# Patient Record
Sex: Female | Born: 1975 | Race: White | Hispanic: No | Marital: Married | Smoking: Never smoker
Health system: Southern US, Community
[De-identification: ages and names within clinical notes are randomized; demographics above are authoritative.]

## PROBLEM LIST (undated history)

## (undated) DIAGNOSIS — T7840XA Allergy, unspecified, initial encounter: Secondary | ICD-10-CM

## (undated) DIAGNOSIS — R51 Headache: Secondary | ICD-10-CM

## (undated) DIAGNOSIS — K649 Unspecified hemorrhoids: Secondary | ICD-10-CM

## (undated) HISTORY — DX: Unspecified hemorrhoids: K64.9

## (undated) HISTORY — DX: Allergy, unspecified, initial encounter: T78.40XA

## (undated) HISTORY — PX: OTHER SURGICAL HISTORY: SHX169

## (undated) HISTORY — PX: WISDOM TOOTH EXTRACTION: SHX21

---

## 2000-06-07 ENCOUNTER — Other Ambulatory Visit: Admission: RE | Admit: 2000-06-07 | Discharge: 2000-06-07 | Payer: Self-pay | Admitting: Obstetrics and Gynecology

## 2001-10-10 ENCOUNTER — Other Ambulatory Visit: Admission: RE | Admit: 2001-10-10 | Discharge: 2001-10-10 | Payer: Self-pay | Admitting: Obstetrics and Gynecology

## 2002-11-19 ENCOUNTER — Other Ambulatory Visit: Admission: RE | Admit: 2002-11-19 | Discharge: 2002-11-19 | Payer: Self-pay | Admitting: Obstetrics and Gynecology

## 2003-12-02 ENCOUNTER — Other Ambulatory Visit: Admission: RE | Admit: 2003-12-02 | Discharge: 2003-12-02 | Payer: Self-pay | Admitting: Obstetrics and Gynecology

## 2005-01-17 ENCOUNTER — Other Ambulatory Visit: Admission: RE | Admit: 2005-01-17 | Discharge: 2005-01-17 | Payer: Self-pay | Admitting: Obstetrics and Gynecology

## 2006-02-08 ENCOUNTER — Other Ambulatory Visit: Admission: RE | Admit: 2006-02-08 | Discharge: 2006-02-08 | Payer: Self-pay | Admitting: Obstetrics and Gynecology

## 2009-09-29 ENCOUNTER — Inpatient Hospital Stay (HOSPITAL_COMMUNITY): Admission: AD | Admit: 2009-09-29 | Discharge: 2009-10-02 | Payer: Self-pay | Admitting: Obstetrics and Gynecology

## 2010-02-09 ENCOUNTER — Encounter: Admission: RE | Admit: 2010-02-09 | Discharge: 2010-02-09 | Payer: Self-pay | Admitting: Family Medicine

## 2011-04-05 LAB — CBC
HCT: 28.4 % — ABNORMAL LOW (ref 36.0–46.0)
MCHC: 34.6 g/dL (ref 30.0–36.0)
MCV: 94.9 fL (ref 78.0–100.0)
RDW: 12.9 % (ref 11.5–15.5)

## 2011-04-06 LAB — CBC
HCT: 36 % (ref 36.0–46.0)
Hemoglobin: 12.4 g/dL (ref 12.0–15.0)
MCHC: 34.5 g/dL (ref 30.0–36.0)
MCV: 94.5 fL (ref 78.0–100.0)
Platelets: 237 K/uL (ref 150–400)
RBC: 3.81 MIL/uL — ABNORMAL LOW (ref 3.87–5.11)
RDW: 12.6 % (ref 11.5–15.5)
WBC: 10.6 K/uL — ABNORMAL HIGH (ref 4.0–10.5)

## 2011-08-27 ENCOUNTER — Encounter (HOSPITAL_COMMUNITY): Payer: Self-pay | Admitting: *Deleted

## 2011-08-27 NOTE — H&P (Signed)
35 year old female G2P1 with missed ab  Ultrasound in office revealed fetus size less than dates and no heart tones Fetus measured 8 weeks and 5 days Pt reports some spotting over past week  Med hx Unremarkable  surg hx None  meds None  All Compazine  Af vss Lung ctab Car rrr  Pelvic Uterus 8 week size  Imp Missed ab  Plan d and e  Risks discussed with patient Rh positive

## 2011-08-28 ENCOUNTER — Encounter (HOSPITAL_COMMUNITY): Payer: Self-pay | Admitting: *Deleted

## 2011-08-28 ENCOUNTER — Ambulatory Visit (HOSPITAL_COMMUNITY): Payer: 59 | Admitting: Anesthesiology

## 2011-08-28 ENCOUNTER — Other Ambulatory Visit: Payer: Self-pay | Admitting: Obstetrics and Gynecology

## 2011-08-28 ENCOUNTER — Encounter (HOSPITAL_COMMUNITY): Payer: Self-pay | Admitting: Anesthesiology

## 2011-08-28 ENCOUNTER — Ambulatory Visit (HOSPITAL_COMMUNITY)
Admission: RE | Admit: 2011-08-28 | Discharge: 2011-08-28 | Disposition: A | Discharge status: Home / Self Care | Payer: 59 | Source: Ambulatory Visit | Attending: Obstetrics and Gynecology | Admitting: Obstetrics and Gynecology

## 2011-08-28 ENCOUNTER — Encounter (HOSPITAL_COMMUNITY)
Admission: RE | Disposition: A | Discharge status: Home / Self Care | Payer: Self-pay | Source: Ambulatory Visit | Attending: Obstetrics and Gynecology

## 2011-08-28 DIAGNOSIS — O021 Missed abortion: Secondary | ICD-10-CM | POA: Insufficient documentation

## 2011-08-28 HISTORY — DX: Headache: R51

## 2011-08-28 HISTORY — PX: DILATION AND EVACUATION: SHX1459

## 2011-08-28 LAB — CBC
HCT: 36.7 % (ref 36.0–46.0)
Hemoglobin: 12.1 g/dL (ref 12.0–15.0)
MCHC: 33 g/dL (ref 30.0–36.0)
RBC: 3.9 MIL/uL (ref 3.87–5.11)
RDW: 12.2 % (ref 11.5–15.5)

## 2011-08-28 LAB — ABO/RH: ABO/RH(D): O POS

## 2011-08-28 SURGERY — DILATION AND EVACUATION, UTERUS
Anesthesia: Monitor Anesthesia Care | Site: Uterus | Wound class: Clean Contaminated

## 2011-08-28 MED ORDER — FENTANYL CITRATE 0.05 MG/ML IJ SOLN
INTRAMUSCULAR | Status: DC | PRN
  Administered 2011-08-28 (×2): 50 ug via INTRAVENOUS

## 2011-08-28 MED ORDER — LACTATED RINGERS IV SOLN
INTRAVENOUS | Status: DC
  Administered 2011-08-28: 07:00:00 via INTRAVENOUS

## 2011-08-28 MED ORDER — FENTANYL CITRATE 0.05 MG/ML IJ SOLN: 25.0000 ug | INTRAMUSCULAR | Status: DC | PRN

## 2011-08-28 MED ORDER — LIDOCAINE HCL (CARDIAC) 20 MG/ML IV SOLN
INTRAVENOUS | Status: DC | PRN
  Administered 2011-08-28: 60 mg via INTRAVENOUS

## 2011-08-28 MED ORDER — MIDAZOLAM HCL 5 MG/5ML IJ SOLN
INTRAMUSCULAR | Status: DC | PRN
  Administered 2011-08-28: 2 mg via INTRAVENOUS

## 2011-08-28 MED ORDER — ACETAMINOPHEN 325 MG PO TABS: 325.0000 mg | ORAL_TABLET | ORAL | Status: DC | PRN

## 2011-08-28 MED ORDER — MEPERIDINE HCL 25 MG/ML IJ SOLN: 6.2500 mg | INTRAMUSCULAR | Status: DC | PRN

## 2011-08-28 MED ORDER — KETOROLAC TROMETHAMINE 30 MG/ML IJ SOLN: 15.0000 mg | Freq: Once | INTRAMUSCULAR | Status: DC | PRN

## 2011-08-28 MED ORDER — ONDANSETRON HCL 4 MG/2ML IJ SOLN
INTRAMUSCULAR | Status: DC | PRN
  Administered 2011-08-28: 4 mg via INTRAVENOUS

## 2011-08-28 MED ORDER — MIDAZOLAM HCL 2 MG/2ML IJ SOLN
INTRAMUSCULAR | Status: AC
  Filled 2011-08-28: qty 2

## 2011-08-28 MED ORDER — KETOROLAC TROMETHAMINE 30 MG/ML IJ SOLN
INTRAMUSCULAR | Status: AC
  Filled 2011-08-28: qty 1

## 2011-08-28 MED ORDER — FENTANYL CITRATE 0.05 MG/ML IJ SOLN
INTRAMUSCULAR | Status: AC
  Filled 2011-08-28: qty 2

## 2011-08-28 MED ORDER — PROPOFOL 10 MG/ML IV EMUL
INTRAVENOUS | Status: AC
  Filled 2011-08-28: qty 20

## 2011-08-28 MED ORDER — LIDOCAINE HCL (CARDIAC) 20 MG/ML IV SOLN
INTRAVENOUS | Status: AC
  Filled 2011-08-28: qty 5

## 2011-08-28 MED ORDER — PROMETHAZINE HCL 25 MG/ML IJ SOLN: 6.2500 mg | INTRAMUSCULAR | Status: DC | PRN

## 2011-08-28 MED ORDER — DEXAMETHASONE SODIUM PHOSPHATE 10 MG/ML IJ SOLN
INTRAMUSCULAR | Status: AC
  Filled 2011-08-28: qty 1

## 2011-08-28 MED ORDER — LIDOCAINE HCL 1 % IJ SOLN
INTRAMUSCULAR | Status: DC | PRN
  Administered 2011-08-28: 10 mL

## 2011-08-28 MED ORDER — PROPOFOL 10 MG/ML IV EMUL
INTRAVENOUS | Status: DC | PRN
  Administered 2011-08-28: 30 mg via INTRAVENOUS
  Administered 2011-08-28 (×5): 20 mg via INTRAVENOUS

## 2011-08-28 MED ORDER — ONDANSETRON HCL 4 MG/2ML IJ SOLN
INTRAMUSCULAR | Status: AC
  Filled 2011-08-28: qty 2

## 2011-08-28 MED ORDER — DEXTROSE 5 % IV SOLN
1.0000 g | INTRAVENOUS | Status: AC
  Administered 2011-08-28: 1 g via INTRAVENOUS
  Filled 2011-08-28: qty 1

## 2011-08-28 MED ORDER — DEXAMETHASONE SODIUM PHOSPHATE 10 MG/ML IJ SOLN
INTRAMUSCULAR | Status: DC | PRN
  Administered 2011-08-28: 10 mg via INTRAVENOUS

## 2011-08-28 MED ORDER — KETOROLAC TROMETHAMINE 30 MG/ML IJ SOLN
INTRAMUSCULAR | Status: DC | PRN
  Administered 2011-08-28: 30 mg via INTRAVENOUS

## 2011-08-28 MED ORDER — ONDANSETRON HCL 4 MG/2ML IJ SOLN: 4.0000 mg | Freq: Once | INTRAMUSCULAR | Status: DC | PRN

## 2011-08-28 SURGICAL SUPPLY — 15 items
CATH ROBINSON RED A/P 16FR (CATHETERS) ×2 IMPLANT
CLOTH BEACON ORANGE TIMEOUT ST (SAFETY) ×2 IMPLANT
DECANTER SPIKE VIAL GLASS SM (MISCELLANEOUS) ×2 IMPLANT
GLOVE BIO SURGEON STRL SZ 6.5 (GLOVE) ×4 IMPLANT
GOWN PREVENTION PLUS LG XLONG (DISPOSABLE) ×4 IMPLANT
KIT BERKELEY 1ST TRIMESTER 3/8 (MISCELLANEOUS) ×2 IMPLANT
NEEDLE SPNL 22GX3.5 QUINCKE BK (NEEDLE) ×2 IMPLANT
NS IRRIG 1000ML POUR BTL (IV SOLUTION) ×2 IMPLANT
PACK VAGINAL MINOR WOMEN LF (CUSTOM PROCEDURE TRAY) ×2 IMPLANT
PAD PREP 24X48 CUFFED NSTRL (MISCELLANEOUS) ×2 IMPLANT
SET BERKELEY SUCTION TUBING (SUCTIONS) ×2 IMPLANT
SYR CONTROL 10ML LL (SYRINGE) ×2 IMPLANT
TOWEL OR 17X24 6PK STRL BLUE (TOWEL DISPOSABLE) ×4 IMPLANT
VACURETTE 8 RIGID CVD (CANNULA) ×2 IMPLANT
WATER STERILE IRR 1000ML POUR (IV SOLUTION) ×2 IMPLANT

## 2011-08-28 NOTE — Op Note (Signed)
Samantha Watts, Samantha Watts               ACCOUNT NO.:  [id number]  MEDICAL RECORD NO.:  [id number]  LOCATION:  [facility]                          FACILITY:  [facility]  PHYSICIAN:  Adrea Sherpa L. Zilah Villaflor, M.D.DATE OF BIRTH:  [birth year]  DATE OF PROCEDURE:  08/28/2011 DATE OF DISCHARGE:                              OPERATIVE REPORT   PREOPERATIVE DIAGNOSIS:  Missed abortion.  POSTOPERATIVE DIAGNOSIS:  Missed abortion.  PROCEDURE:  Dilatation and evacuation.  SURGEON:  Nakiesha Rumsey L. Gergory Biello, MD  ANESTHESIA:  Local with paracervical block with IV sedation.  ESTIMATED BLOOD LOSS:  Minimal.  COMPLICATIONS:  None.  PATHOLOGY:  POC.  PROCEDURE:  The patient was taken to the operating room.  She was given sedation and prepped and draped in the usual sterile fashion.  An in-and- out catheter was used to empty the bladder.  The speculum was inserted and cervix was grasped with a tenaculum.  Paracervical block was performed in standard fashion.  The cervical internal os was gently dilated using Pratt dilators and then #8 suction cannula was inserted and the suction curettage was performed x3 with retrieval of contents consistent with products of conception.  Sharp curette was inserted and the uterus was thoroughly curetted of all tissue and a final suction curettage was performed.  The uterine cavity was completely clean.  All instruments removed from the vagina.  All sponge, lap, and instrument counts were correct x2.  The patient went to recovery room in stable condition.     Len Azeez L. Vincente Poli, M.D.     Florestine Avers  D:  08/28/2011  T:  08/28/2011  Job:  [id number]

## 2011-08-28 NOTE — Anesthesia Postprocedure Evaluation (Signed)
Anesthesia Post Note  Patient: Samantha Watts  Procedure(s) Performed:  DILATATION AND EVACUATION (D&E)  Anesthesia type: MAC  Patient location: PACU  Post pain: Pain level controlled  Post assessment: Post-op Vital signs reviewed  Last Vitals:  Filed Vitals:   08/28/11 0745  BP: 100/61  Pulse: 78  Temp: 98.1 F (36.7 C)  Resp: 15    Post vital signs: Reviewed  Level of consciousness: sedated  Complications: No apparent anesthesia complications

## 2011-08-28 NOTE — Transfer of Care (Signed)
  Anesthesia Post-op Note  Patient: Samantha Watts  Procedure(s) Performed:  DILATATION AND EVACUATION (D&E)  Patient Location: PACU  Anesthesia Type: MAC  Level of Consciousness: awake, alert  and oriented  Airway and Oxygen Therapy: Patient Spontanous Breathing and Patient connected to nasal cannula oxygen  Post-op Pain: none  Post-op Assessment: Post-op Vital signs reviewed and Patient's Cardiovascular Status Stable  Post-op Vital Signs: Reviewed and stable  Complications: No apparent anesthesia complications

## 2011-08-28 NOTE — Preoperative (Signed)
Beta Blockers   Reason not to administer Beta Blockers:Not Applicable 

## 2011-08-28 NOTE — Progress Notes (Signed)
H and P reviewed. No changes Will proceed with D and E

## 2011-08-28 NOTE — Anesthesia Preprocedure Evaluation (Signed)
Anesthesia Evaluation  Name, MR# and DOB Patient awake  General Assessment Comment  Reviewed: Allergy & Precautions, H&P , Patient's Chart, lab work & pertinent test results, reviewed documented beta blocker date and time   History of Anesthesia Complications Negative for: history of anesthetic complications  Airway Mallampati: II TM Distance: >3 FB Neck ROM: full    Dental No notable dental hx.    Pulmonary  clear to auscultation  pulmonary exam normalPulmonary Exam Normal breath sounds clear to auscultation none    Cardiovascular Exercise Tolerance: Good regular Normal    Neuro/Psych Negative Neurological ROS  Negative Psych ROS  GI/Hepatic/Renal negative GI ROS  negative Liver ROS  negative Renal ROS        Endo/Other  Negative Endocrine ROS (+)      Abdominal   Musculoskeletal   Hematology negative hematology ROS (+)   Peds  Reproductive/Obstetrics negative OB ROS    Anesthesia Other Findings 11 week loss            Anesthesia Physical Anesthesia Plan  ASA: I  Anesthesia Plan: MAC   Post-op Pain Management:    Induction:   Airway Management Planned:   Additional Equipment:   Intra-op Plan:   Post-operative Plan:   Informed Consent: I have reviewed the patients History and Physical, chart, labs and discussed the procedure including the risks, benefits and alternatives for the proposed anesthesia with the patient or authorized representative who has indicated his/her understanding and acceptance.   Dental Advisory Given  Plan Discussed with: CRNA and Surgeon  Anesthesia Plan Comments:         Anesthesia Quick Evaluation

## 2011-08-28 NOTE — Brief Op Note (Signed)
08/28/2011  7:46 AM  PATIENT:  Samantha Watts  35 y.o. female  PRE-OPERATIVE DIAGNOSIS:  Missed Abortion  POST-OPERATIVE DIAGNOSIS:  Missed Abortion  PROCEDURE:  Procedure(s): DILATATION AND EVACUATION (D&E)  SURGEON:  Surgeon(s): Jeani Hawking, MD  PHYSICIAN ASSISTANT:   ASSISTANTS: none   ANESTHESIA:   local, IV sedation and paracervical block  ESTIMATED BLOOD LOSS: minimal  BLOOD ADMINISTERED:none  DRAINS: none   LOCAL MEDICATIONS USED:  MARCAINE 10 CC  SPECIMEN:  Source of Specimen:  poc  DISPOSITION OF SPECIMEN:  PATHOLOGY  COUNTS:  YES  TOURNIQUET:  * No tourniquets in log *  DICTATION #: [id number]  PLAN OF CARE: to pacu  PATIENT DISPOSITION:  PACU - hemodynamically stable.   Delay start of Pharmacological VTE agent (>24hrs) due to surgical blood loss or risk of bleeding:  not applicable

## 2011-09-01 DEATH — deceased

## 2011-09-11 ENCOUNTER — Encounter (HOSPITAL_COMMUNITY): Payer: Self-pay | Admitting: Obstetrics and Gynecology

## 2011-12-07 ENCOUNTER — Other Ambulatory Visit: Payer: Self-pay | Admitting: Obstetrics and Gynecology

## 2012-07-17 ENCOUNTER — Encounter (INDEPENDENT_AMBULATORY_CARE_PROVIDER_SITE_OTHER): Payer: Self-pay | Admitting: General Surgery

## 2012-07-17 ENCOUNTER — Ambulatory Visit (INDEPENDENT_AMBULATORY_CARE_PROVIDER_SITE_OTHER): Payer: 59 | Admitting: General Surgery

## 2012-07-17 VITALS — BP 132/88 | HR 68 | Temp 98.2°F | Resp 14 | Ht 61.5 in | Wt 125.4 lb

## 2012-07-17 DIAGNOSIS — L290 Pruritus ani: Secondary | ICD-10-CM

## 2012-07-17 NOTE — Patient Instructions (Signed)
Aquaphor on a daily basis Hydrocortisone if itching flares up Call if it flares up again

## 2012-07-28 NOTE — Progress Notes (Signed)
Subjective:     Patient ID: Samantha Watts, female   DOB: May 27, 1976, 36 y.o.   MRN: [id number]  HPI We are asked to see the patient in consultation by Dr. Rogelia Boga to evaluate her for hemorrhoids. The patient is a 36 year old female who has been experiencing itching in her perirectal area off and on for the last 10 years or so. It seems to be worse when she had a baby about 2-1/2 years ago. She denies any real pain. She does have some occasional bleeding when she wipes.  Review of Systems  Constitutional: Negative.   HENT: Negative.   Eyes: Negative.   Respiratory: Negative.   Cardiovascular: Negative.   Gastrointestinal: Negative.   Genitourinary: Negative.   Musculoskeletal: Negative.   Skin: Negative.   Neurological: Negative.   Hematological: Negative.   Psychiatric/Behavioral: Negative.        Objective:   Physical Exam  Constitutional: She is oriented to person, place, and time. She appears well-developed and well-nourished.  HENT:  Head: Normocephalic and atraumatic.  Eyes: Conjunctivae and EOM are normal. Pupils are equal, round, and reactive to light.  Neck: Normal range of motion. Neck supple.  Cardiovascular: Normal rate, regular rhythm and normal heart sounds.   Pulmonary/Chest: Effort normal and breath sounds normal.  Abdominal: Soft. Bowel sounds are normal.  Genitourinary:       On rectal exam she has minimal hemorrhoidal tissue. There does not appear to be any acute inflammation. She may have some irritation to the skin around the rectum. On digital exam I cannot palpate any masses. There is good rectal tone  Musculoskeletal: Normal range of motion.  Neurological: She is alert and oriented to person, place, and time.  Skin: Skin is warm and dry.  Psychiatric: She has a normal mood and affect. Her behavior is normal.       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 She  may use hydrocortisone to 3 times a day when she has a flareup. I have encouraged her to call us if she has a flareup. Otherwise we will see her back on a p.r.n. basis

## 2012-09-02 ENCOUNTER — Other Ambulatory Visit (HOSPITAL_COMMUNITY): Payer: Self-pay | Admitting: Obstetrics and Gynecology

## 2012-09-02 DIAGNOSIS — N96 Recurrent pregnancy loss: Secondary | ICD-10-CM

## 2012-09-03 ENCOUNTER — Encounter (HOSPITAL_COMMUNITY): Payer: Self-pay

## 2012-09-03 ENCOUNTER — Ambulatory Visit (HOSPITAL_COMMUNITY)
Admission: RE | Admit: 2012-09-03 | Discharge: 2012-09-03 | Disposition: A | Discharge status: Home / Self Care | Payer: 59 | Source: Ambulatory Visit | Attending: Obstetrics and Gynecology | Admitting: Obstetrics and Gynecology

## 2012-09-03 DIAGNOSIS — N979 Female infertility, unspecified: Secondary | ICD-10-CM | POA: Insufficient documentation

## 2012-09-03 DIAGNOSIS — N96 Recurrent pregnancy loss: Secondary | ICD-10-CM

## 2012-09-03 MED ORDER — IOHEXOL 300 MG/ML  SOLN
10.0000 mL | Freq: Once | INTRAMUSCULAR | Status: AC | PRN
  Administered 2012-09-03: 10 mL

## 2012-10-09 ENCOUNTER — Other Ambulatory Visit: Payer: Self-pay | Admitting: Obstetrics and Gynecology

## 2013-07-07 LAB — OB RESULTS CONSOLE GC/CHLAMYDIA
Chlamydia: NEGATIVE
Gonorrhea: NEGATIVE

## 2013-07-07 LAB — OB RESULTS CONSOLE ANTIBODY SCREEN: ANTIBODY SCREEN: NEGATIVE

## 2013-07-07 LAB — OB RESULTS CONSOLE RUBELLA ANTIBODY, IGM: RUBELLA: IMMUNE

## 2013-07-07 LAB — OB RESULTS CONSOLE HIV ANTIBODY (ROUTINE TESTING): HIV: NONREACTIVE

## 2013-07-07 LAB — OB RESULTS CONSOLE RPR: RPR: NONREACTIVE

## 2013-07-07 LAB — OB RESULTS CONSOLE HEPATITIS B SURFACE ANTIGEN: Hepatitis B Surface Ag: NEGATIVE

## 2013-07-09 ENCOUNTER — Other Ambulatory Visit: Payer: Self-pay | Admitting: Obstetrics and Gynecology

## 2013-10-12 IMAGING — RF DG HYSTEROGRAM
4 series · 8 of 8 positions shown · non-contrast
Comparison: none

CLINICAL DATA: Infertility

HYSTEROSALPINGOGRAM
TECHNIQUE: Hysterosalpingogram was performed by the ordering
physician under fluoroscopy.  Fluoroscopic images are submitted for
interpretation following the procedure.
Fluoroscopy Time:  0.7 minutes.

[Series 1: run · 2 of 2 slices shown (1 of 4)]
[im 1/2]
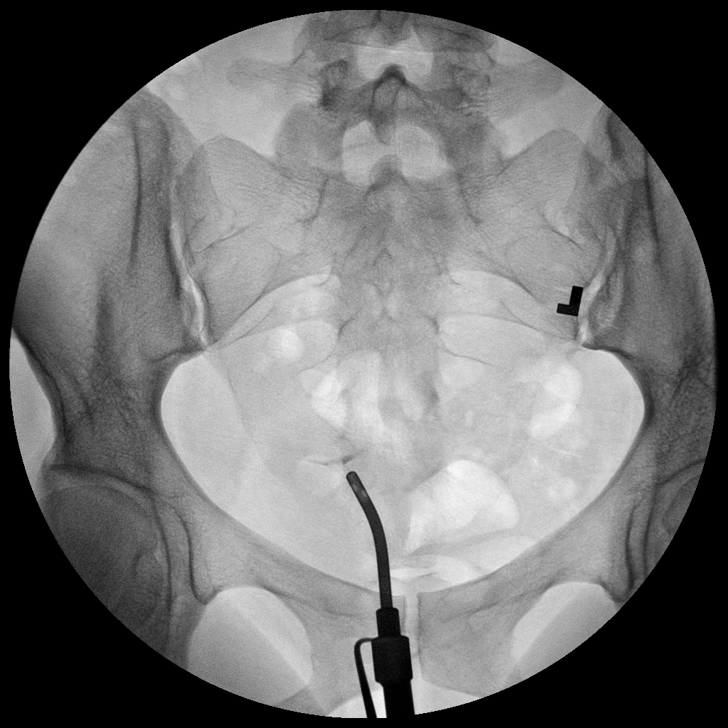
[im 2/2]
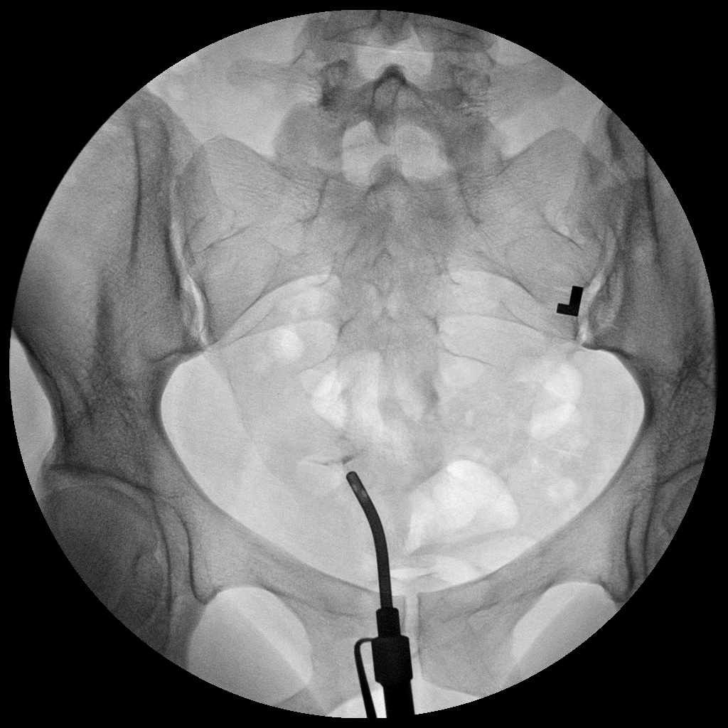

[Series 2: run · 2 of 2 slices shown (2 of 4)]
[im 1/2]
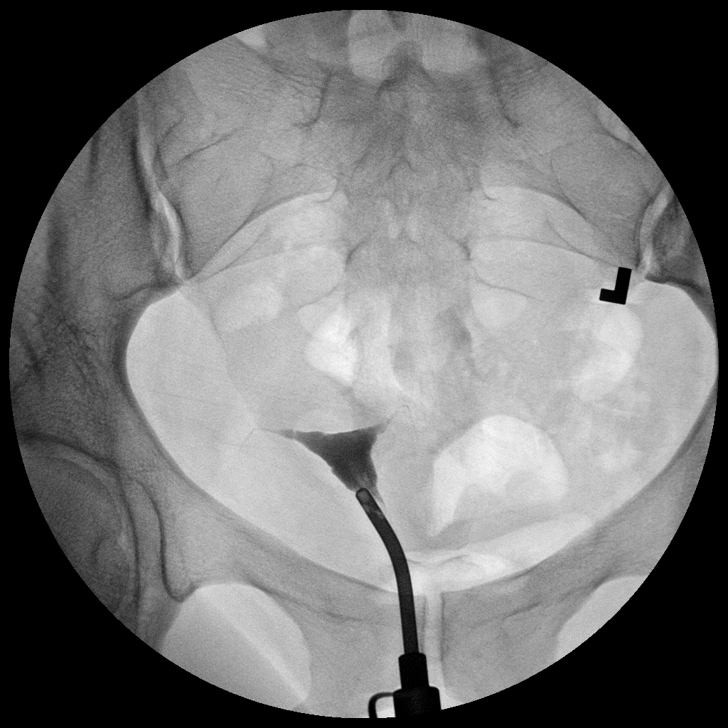
[im 2/2]
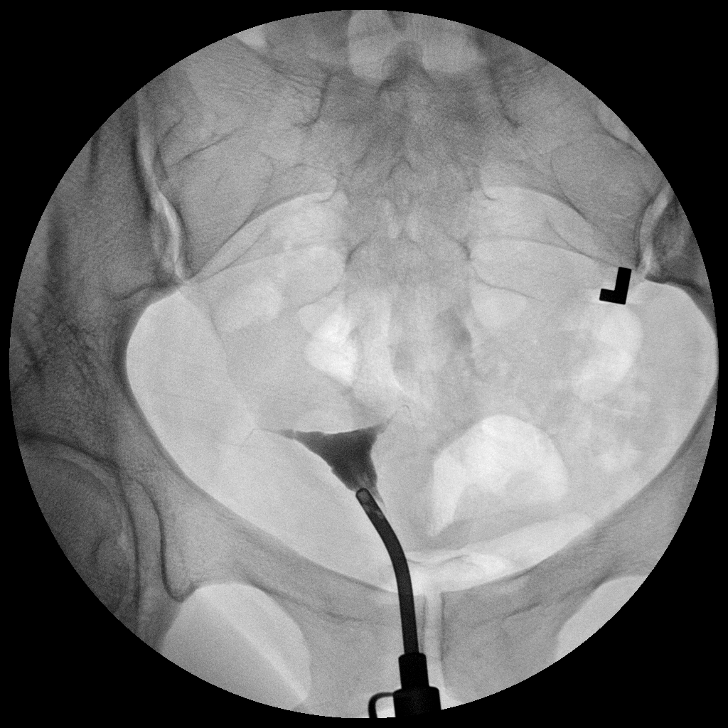

[Series 3: run · 2 of 2 slices shown (3 of 4)]
[im 1/2]
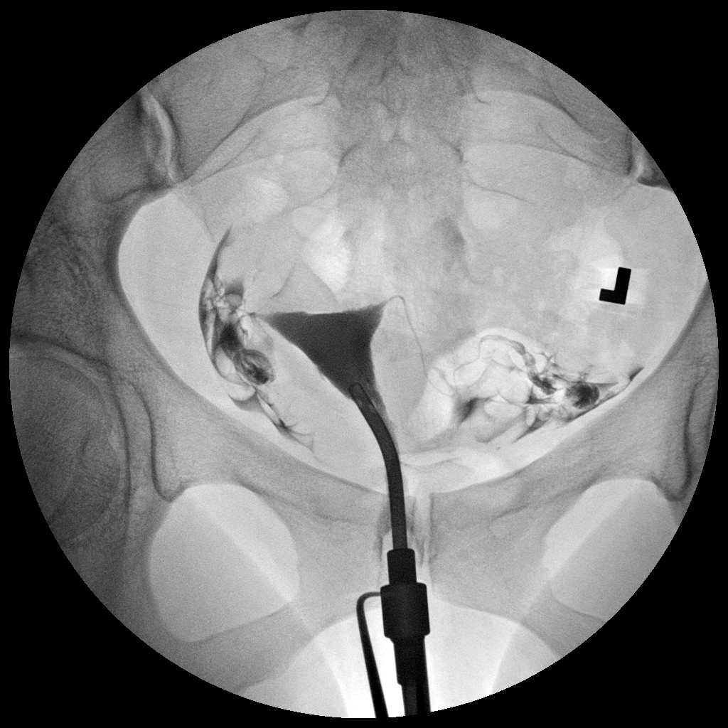
[im 2/2]
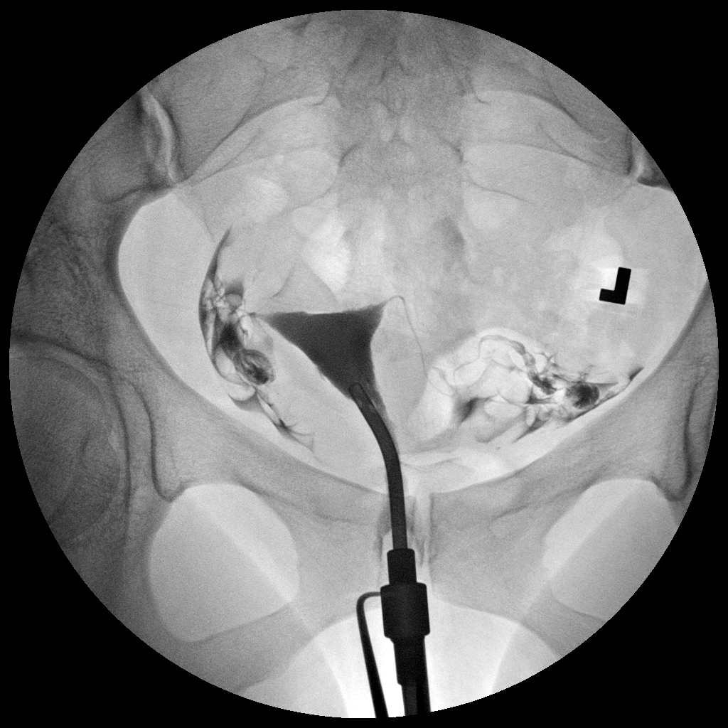

[Series 4: run · 2 of 2 slices shown (4 of 4)]
[im 1/2]
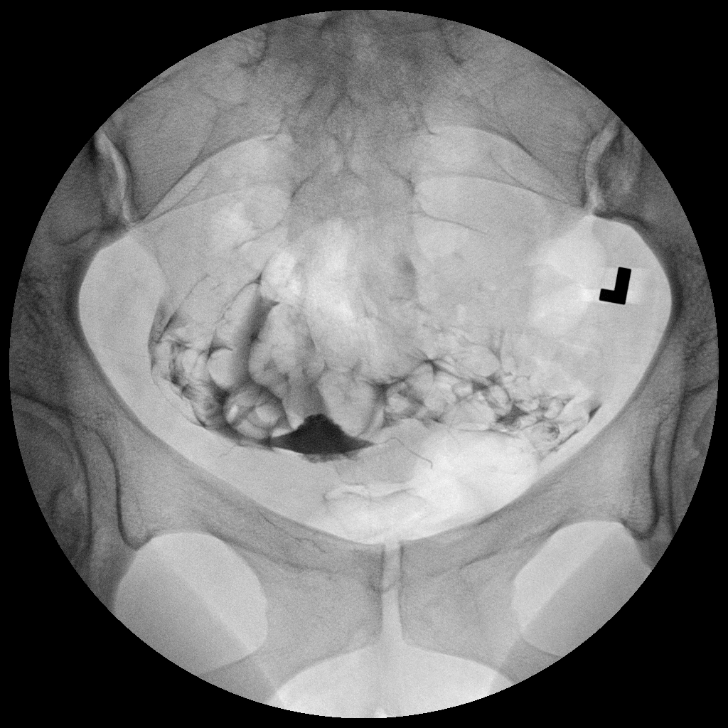
[im 2/2]
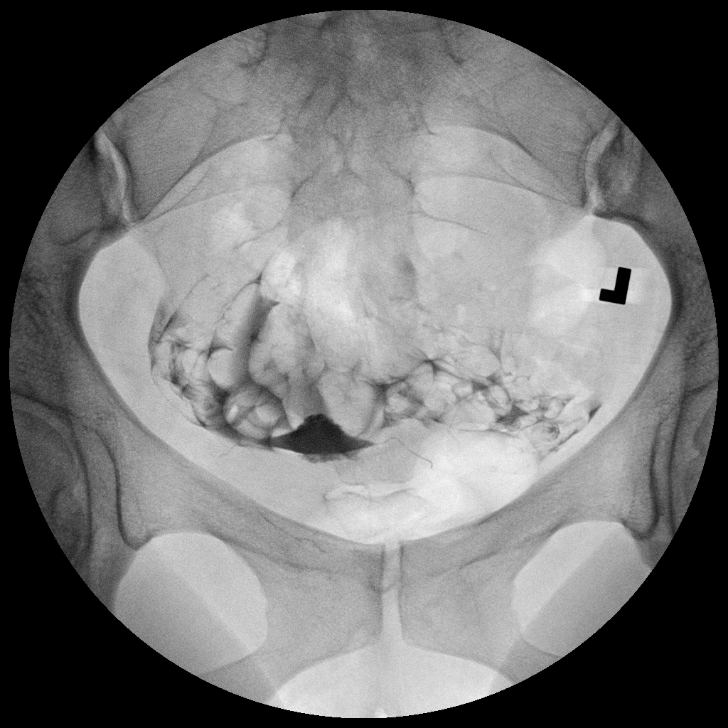

[8 of 8 positions shown; findings below may reference images not displayed]

FINDINGS: The endometrial cavity of the uterus is normal in contour
and appearance.

Contrast filling of both fallopian tubes is seen, and both tubes
are normal in appearance.  Intraperitoneal spill of the contrast
from both fallopian tubes is demonstrated.
IMPRESSION: Normal study.  Fallopian tubes are patent bilaterally.

## 2014-01-08 LAB — OB RESULTS CONSOLE GBS: GBS: POSITIVE

## 2014-01-26 ENCOUNTER — Encounter (HOSPITAL_COMMUNITY): Payer: 59 | Admitting: Anesthesiology

## 2014-01-26 ENCOUNTER — Inpatient Hospital Stay (HOSPITAL_COMMUNITY)
Admission: AD | Admit: 2014-01-26 | Discharge: 2014-01-27 | DRG: 775 | Disposition: A | Discharge status: Home / Self Care | Payer: 59 | Source: Ambulatory Visit | Attending: Obstetrics and Gynecology | Admitting: Obstetrics and Gynecology

## 2014-01-26 ENCOUNTER — Inpatient Hospital Stay (HOSPITAL_COMMUNITY): Payer: 59 | Admitting: Anesthesiology

## 2014-01-26 ENCOUNTER — Encounter (HOSPITAL_COMMUNITY): Payer: Self-pay | Admitting: *Deleted

## 2014-01-26 DIAGNOSIS — Z3490 Encounter for supervision of normal pregnancy, unspecified, unspecified trimester: Secondary | ICD-10-CM

## 2014-01-26 DIAGNOSIS — O9989 Other specified diseases and conditions complicating pregnancy, childbirth and the puerperium: Secondary | ICD-10-CM

## 2014-01-26 DIAGNOSIS — O878 Other venous complications in the puerperium: Secondary | ICD-10-CM | POA: Diagnosis present

## 2014-01-26 DIAGNOSIS — K649 Unspecified hemorrhoids: Secondary | ICD-10-CM | POA: Diagnosis present

## 2014-01-26 DIAGNOSIS — O99892 Other specified diseases and conditions complicating childbirth: Secondary | ICD-10-CM | POA: Diagnosis present

## 2014-01-26 DIAGNOSIS — Z22330 Carrier of Group B streptococcus: Secondary | ICD-10-CM

## 2014-01-26 DIAGNOSIS — O09529 Supervision of elderly multigravida, unspecified trimester: Secondary | ICD-10-CM | POA: Diagnosis present

## 2014-01-26 LAB — CBC
HEMATOCRIT: 36.2 % (ref 36.0–46.0)
HEMOGLOBIN: 12.5 g/dL (ref 12.0–15.0)
MCH: 30.7 pg (ref 26.0–34.0)
MCHC: 34.5 g/dL (ref 30.0–36.0)
MCV: 88.9 fL (ref 78.0–100.0)
Platelets: 209 10*3/uL (ref 150–400)
RBC: 4.07 MIL/uL (ref 3.87–5.11)
RDW: 13.5 % (ref 11.5–15.5)
WBC: 9.3 10*3/uL (ref 4.0–10.5)

## 2014-01-26 LAB — POCT FERN TEST: POCT Fern Test: POSITIVE

## 2014-01-26 LAB — RPR: RPR Ser Ql: NONREACTIVE

## 2014-01-26 MED ORDER — LIDOCAINE HCL (PF) 1 % IJ SOLN
INTRAMUSCULAR | Status: DC | PRN
  Administered 2014-01-26 (×2): 5 mL

## 2014-01-26 MED ORDER — EPHEDRINE 5 MG/ML INJ
10.0000 mg | INTRAVENOUS | Status: DC | PRN
  Filled 2014-01-26: qty 2

## 2014-01-26 MED ORDER — OXYTOCIN 40 UNITS IN LACTATED RINGERS INFUSION - SIMPLE MED
62.5000 mL/h | INTRAVENOUS | Status: DC
  Administered 2014-01-26: 62.5 mL/h via INTRAVENOUS
  Filled 2014-01-26: qty 1000

## 2014-01-26 MED ORDER — LIDOCAINE HCL (PF) 1 % IJ SOLN
30.0000 mL | INTRAMUSCULAR | Status: DC | PRN
  Filled 2014-01-26: qty 30

## 2014-01-26 MED ORDER — BUPIVACAINE HCL (PF) 0.25 % IJ SOLN
INTRAMUSCULAR | Status: DC | PRN
  Administered 2014-01-26: 10 mL

## 2014-01-26 MED ORDER — FENTANYL 2.5 MCG/ML BUPIVACAINE 1/10 % EPIDURAL INFUSION (WH - ANES)
14.0000 mL/h | INTRAMUSCULAR | Status: DC | PRN
  Administered 2014-01-26: 14 mL/h via EPIDURAL

## 2014-01-26 MED ORDER — PRENATAL MULTIVITAMIN CH
1.0000 | ORAL_TABLET | Freq: Every day | ORAL | Status: DC
  Administered 2014-01-27: 1 via ORAL
  Filled 2014-01-26: qty 1

## 2014-01-26 MED ORDER — PENICILLIN G POTASSIUM 5000000 UNITS IJ SOLR
5.0000 10*6.[IU] | Freq: Once | INTRAVENOUS | Status: AC
  Administered 2014-01-26: 5 10*6.[IU] via INTRAVENOUS
  Filled 2014-01-26: qty 5

## 2014-01-26 MED ORDER — OXYCODONE-ACETAMINOPHEN 5-325 MG PO TABS: 1.0000 | ORAL_TABLET | ORAL | Status: DC | PRN

## 2014-01-26 MED ORDER — TETANUS-DIPHTH-ACELL PERTUSSIS 5-2.5-18.5 LF-MCG/0.5 IM SUSP
0.5000 mL | Freq: Once | INTRAMUSCULAR | Status: AC
  Administered 2014-01-27: 0.5 mL via INTRAMUSCULAR

## 2014-01-26 MED ORDER — ONDANSETRON HCL 4 MG/2ML IJ SOLN: 4.0000 mg | Freq: Four times a day (QID) | INTRAMUSCULAR | Status: DC | PRN

## 2014-01-26 MED ORDER — SENNOSIDES-DOCUSATE SODIUM 8.6-50 MG PO TABS
2.0000 | ORAL_TABLET | ORAL | Status: DC
  Administered 2014-01-27: 2 via ORAL
  Filled 2014-01-26: qty 2

## 2014-01-26 MED ORDER — DIPHENHYDRAMINE HCL 25 MG PO CAPS: 25.0000 mg | ORAL_CAPSULE | Freq: Four times a day (QID) | ORAL | Status: DC | PRN

## 2014-01-26 MED ORDER — DIBUCAINE 1 % RE OINT: 1.0000 "application " | TOPICAL_OINTMENT | RECTAL | Status: DC | PRN

## 2014-01-26 MED ORDER — MEASLES, MUMPS & RUBELLA VAC ~~LOC~~ INJ: 0.5000 mL | INJECTION | Freq: Once | SUBCUTANEOUS | Status: DC

## 2014-01-26 MED ORDER — LACTATED RINGERS IV SOLN
INTRAVENOUS | Status: DC
  Administered 2014-01-26: 12:00:00 via INTRAVENOUS

## 2014-01-26 MED ORDER — ONDANSETRON HCL 4 MG/2ML IJ SOLN: 4.0000 mg | INTRAMUSCULAR | Status: DC | PRN

## 2014-01-26 MED ORDER — PHENYLEPHRINE 40 MCG/ML (10ML) SYRINGE FOR IV PUSH (FOR BLOOD PRESSURE SUPPORT)
80.0000 ug | PREFILLED_SYRINGE | INTRAVENOUS | Status: DC | PRN
  Filled 2014-01-26: qty 2

## 2014-01-26 MED ORDER — LACTATED RINGERS IV SOLN
500.0000 mL | Freq: Once | INTRAVENOUS | Status: AC
  Administered 2014-01-26: 500 mL via INTRAVENOUS

## 2014-01-26 MED ORDER — EPHEDRINE 5 MG/ML INJ
INTRAVENOUS | Status: AC
  Filled 2014-01-26: qty 4

## 2014-01-26 MED ORDER — IBUPROFEN 600 MG PO TABS
600.0000 mg | ORAL_TABLET | Freq: Four times a day (QID) | ORAL | Status: DC
  Administered 2014-01-26 – 2014-01-27 (×5): 600 mg via ORAL
  Filled 2014-01-26 (×5): qty 1

## 2014-01-26 MED ORDER — DIPHENHYDRAMINE HCL 50 MG/ML IJ SOLN: 12.5000 mg | INTRAMUSCULAR | Status: DC | PRN

## 2014-01-26 MED ORDER — LACTATED RINGERS IV SOLN: 500.0000 mL | INTRAVENOUS | Status: DC | PRN

## 2014-01-26 MED ORDER — LANOLIN HYDROUS EX OINT: TOPICAL_OINTMENT | CUTANEOUS | Status: DC | PRN

## 2014-01-26 MED ORDER — PENICILLIN G POTASSIUM 5000000 UNITS IJ SOLR
2.5000 10*6.[IU] | INTRAVENOUS | Status: DC
  Administered 2014-01-26: 2.5 10*6.[IU] via INTRAVENOUS
  Filled 2014-01-26 (×3): qty 2.5

## 2014-01-26 MED ORDER — TERBUTALINE SULFATE 1 MG/ML IJ SOLN: 0.2500 mg | Freq: Once | INTRAMUSCULAR | Status: DC | PRN

## 2014-01-26 MED ORDER — SIMETHICONE 80 MG PO CHEW: 80.0000 mg | CHEWABLE_TABLET | ORAL | Status: DC | PRN

## 2014-01-26 MED ORDER — MEDROXYPROGESTERONE ACETATE 150 MG/ML IM SUSP: 150.0000 mg | INTRAMUSCULAR | Status: DC | PRN

## 2014-01-26 MED ORDER — ONDANSETRON HCL 4 MG PO TABS: 4.0000 mg | ORAL_TABLET | ORAL | Status: DC | PRN

## 2014-01-26 MED ORDER — OXYTOCIN 40 UNITS IN LACTATED RINGERS INFUSION - SIMPLE MED
1.0000 m[IU]/min | INTRAVENOUS | Status: DC
  Administered 2014-01-26: 2 m[IU]/min via INTRAVENOUS

## 2014-01-26 MED ORDER — IBUPROFEN 600 MG PO TABS: 600.0000 mg | ORAL_TABLET | Freq: Four times a day (QID) | ORAL | Status: DC | PRN

## 2014-01-26 MED ORDER — ACETAMINOPHEN 325 MG PO TABS: 650.0000 mg | ORAL_TABLET | ORAL | Status: DC | PRN

## 2014-01-26 MED ORDER — PHENYLEPHRINE 40 MCG/ML (10ML) SYRINGE FOR IV PUSH (FOR BLOOD PRESSURE SUPPORT)
PREFILLED_SYRINGE | INTRAVENOUS | Status: AC
  Filled 2014-01-26: qty 10

## 2014-01-26 MED ORDER — WITCH HAZEL-GLYCERIN EX PADS
1.0000 | MEDICATED_PAD | CUTANEOUS | Status: DC | PRN
Start: 2014-01-26 — End: 2014-01-27

## 2014-01-26 MED ORDER — BENZOCAINE-MENTHOL 20-0.5 % EX AERO: 1.0000 "application " | INHALATION_SPRAY | CUTANEOUS | Status: DC | PRN

## 2014-01-26 MED ORDER — FENTANYL 2.5 MCG/ML BUPIVACAINE 1/10 % EPIDURAL INFUSION (WH - ANES)
INTRAMUSCULAR | Status: AC
  Administered 2014-01-26: 14 mL/h via EPIDURAL
  Filled 2014-01-26: qty 125

## 2014-01-26 MED ORDER — OXYTOCIN BOLUS FROM INFUSION: 500.0000 mL | INTRAVENOUS | Status: DC

## 2014-01-26 MED ORDER — CITRIC ACID-SODIUM CITRATE 334-500 MG/5ML PO SOLN
30.0000 mL | ORAL | Status: DC | PRN
  Administered 2014-01-26: 30 mL via ORAL
  Filled 2014-01-26: qty 15

## 2014-01-26 NOTE — MAU Note (Signed)
Pt to BR after walking, states her underwear is wet, unsure if SROM.

## 2014-01-26 NOTE — Anesthesia Procedure Notes (Signed)
Epidural Patient location during procedure: OB Start time: 01/26/2014 1:43 PM  Staffing Anesthesiologist: Brayton CavesJACKSON, Lamon Rotundo Performed by: anesthesiologist   Preanesthetic Checklist Completed: patient identified, site marked, surgical consent, pre-op evaluation, timeout performed, IV checked, risks and benefits discussed and monitors and equipment checked  Epidural Patient position: sitting Prep: site prepped and draped and DuraPrep Patient monitoring: continuous pulse ox and blood pressure Approach: midline Injection technique: LOR air  Needle:  Needle type: Tuohy  Needle gauge: 17 G Needle length: 9 cm and 9 Needle insertion depth: 5 cm cm Catheter type: closed end flexible Catheter size: 19 Gauge Catheter at skin depth: 10 cm Test dose: negative  Assessment Events: blood not aspirated, injection not painful, no injection resistance, negative IV test and no paresthesia  Additional Notes Patient identified.  Risk benefits discussed including failed block, incomplete pain control, headache, nerve damage, paralysis, blood pressure changes, nausea, vomiting, reactions to medication both toxic or allergic, and postpartum back pain.  Patient expressed understanding and wished to proceed.  All questions were answered.  Sterile technique used throughout procedure and epidural site dressed with sterile barrier dressing. No paresthesia or other complications noted.The patient did not experience any signs of intravascular injection such as tinnitus or metallic taste in mouth nor signs of intrathecal spread such as rapid motor block. Please see nursing notes for vital signs.

## 2014-01-26 NOTE — H&P (Signed)
Samantha Watts is a 38 y.o. female presenting for SROM.  No VB.  Irregular ctx.  Pregnancy uncomplicated.  History OB History   Grav Para Term Preterm Abortions TAB SAB Ect Mult Living   5 1 1  3  3   1      Past Medical History  Diagnosis Date  . Headache(784.0)   . Hemorrhoids    Past Surgical History  Procedure Laterality Date  . Svd      2012  . Wisdom tooth extraction    . Dilation and evacuation  08/28/2011    Procedure: DILATATION AND EVACUATION (D&E);  Surgeon: Samantha HawkingMichelle L Grewal, MD;  Location: [facility];  Service: Gynecology;  Laterality: N/A;   Family History: family history includes Cancer in her maternal grandmother. Social History:  reports that she has never smoked. She has never used smokeless tobacco. She reports that she drinks alcohol. She reports that she does not use illicit drugs.   Prenatal Transfer Tool  Maternal Diabetes: No Genetic Screening: Declined Maternal Ultrasounds/Referrals: Normal Fetal Ultrasounds or other Referrals:  None Maternal Substance Abuse:  No Significant Maternal Medications:  None Significant Maternal Lab Results:  None Other Comments:  None  ROS  Dilation: 4 Effacement (%): 80 Station: -2 Exam by:: Sowder,RNC Blood pressure 119/73, pulse 80, temperature 97.7 F (36.5 C), temperature source Oral, resp. rate 20, height 5' 1.5" (1.562 m), weight 76.204 kg (168 lb). Exam Physical Exam  Gen - mildly uncomfortable w/ ctx Abd - gravid, NT Ext - NT, no edema Cvx 3cm SROM - clear, 10am Prenatal labs: ABO, Rh:   Antibody: Negative (07/08 0000) Rubella: Immune (07/08 0000) RPR: Nonreactive (07/08 0000)  HBsAg: Negative (07/08 0000)  HIV: Non-reactive (07/08 0000)  GBS: Positive (01/09 0000)   Assessment/Plan: Admit Pitocin augmentation PCN prophylaxis Exp mngt Epidural prn  Samantha Watts 01/26/2014, 1:08 PM

## 2014-01-26 NOTE — Anesthesia Preprocedure Evaluation (Signed)
Anesthesia Evaluation  Patient identified by MRN, date of birth, ID band Patient awake    Reviewed: Allergy & Precautions, H&P , Patient's Chart, lab work & pertinent test results  Airway Mallampati: II TM Distance: >3 FB Neck ROM: full    Dental   Pulmonary  breath sounds clear to auscultation        Cardiovascular Rhythm:regular Rate:Normal     Neuro/Psych  Headaches,    GI/Hepatic   Endo/Other    Renal/GU      Musculoskeletal   Abdominal   Peds  Hematology   Anesthesia Other Findings   Reproductive/Obstetrics (+) Pregnancy                           Anesthesia Physical Anesthesia Plan  ASA: II  Anesthesia Plan: Epidural   Post-op Pain Management:    Induction:   Airway Management Planned:   Additional Equipment:   Intra-op Plan:   Post-operative Plan:   Informed Consent: I have reviewed the patients History and Physical, chart, labs and discussed the procedure including the risks, benefits and alternatives for the proposed anesthesia with the patient or authorized representative who has indicated his/her understanding and acceptance.     Plan Discussed with:   Anesthesia Plan Comments:         Anesthesia Quick Evaluation  

## 2014-01-26 NOTE — MAU Provider Note (Signed)
HPI:  Samantha Watts is a 38 y.o. female [phone] at 4110w3d who presents for a labor evaluation. Pt was sent out walking and upon her return to MAU she noticed that her underwear were wet. I was called to do a speculum exam to assess patient for ROM; pt is GBS positive.   Objective:  GENERAL: Well-developed, well-nourished female in no acute distress.  HEENT: Normocephalic, atraumatic.   LUNGS: Effort normal HEART: Regular rate  SKIN: Warm, dry and without erythema PSYCH: Normal mood and affect  Filed Vitals:   01/26/14 1037  BP: 124/83  Pulse: 90  Temp:   Resp:    Results for orders placed during the hospital encounter of 01/26/14 (from the past 48 hour(s))  POCT FERN TEST     Status: None   Collection Time    01/26/14 11:20 AM      Result Value Range   POCT Fern Test Positive = ruptured amniotic membanes     Speculum exam: Vagina - No fluid pooling in the vaginal canal. Perineum appears wet Cervix - Small amount of clear/pink mucus like discharge at os.  Bimanual exam: Per RN Chaperone present for exam.   RN to call Dr. Renaldo FiddlerAdkins to inform her of spontaneous rupture of membranes Patient is GBS positive    Samantha HansenJennifer Irene Glendel Jaggers, NP 01/26/2014 11:18 AM

## 2014-01-26 NOTE — MAU Note (Signed)
uc's since 0100, stronger since 0400, bloody show.  uc's now 6-8 minutes apart.  Denies LOF.

## 2014-01-26 NOTE — Progress Notes (Signed)
SVD of vigerous female infant w/ apgars of 9,10.  Nuchal cord delivered over shoulder and body. Placenta delivered spontaneous w/ 3VC.   2nd degree lac repaired w/ 3-0 vicryl rapide.  Fundus firm.  EBL 400cc .

## 2014-01-26 NOTE — Progress Notes (Signed)
Pt comfortable w/ epidural.  FHT reassuring Toco Q2-4 Cvx 9.5/C/+1  A/P:  Exp mngt

## 2014-01-27 LAB — CBC
HEMATOCRIT: 32.3 % — AB (ref 36.0–46.0)
HEMOGLOBIN: 10.8 g/dL — AB (ref 12.0–15.0)
MCH: 30.2 pg (ref 26.0–34.0)
MCHC: 33.4 g/dL (ref 30.0–36.0)
MCV: 90.2 fL (ref 78.0–100.0)
Platelets: 161 10*3/uL (ref 150–400)
RBC: 3.58 MIL/uL — ABNORMAL LOW (ref 3.87–5.11)
RDW: 13.7 % (ref 11.5–15.5)
WBC: 9 10*3/uL (ref 4.0–10.5)

## 2014-01-27 NOTE — Discharge Summary (Signed)
Obstetric Discharge Summary Reason for Admission: onset of labor Prenatal Procedures: ultrasound Intrapartum Procedures: spontaneous vaginal delivery Postpartum Procedures: none Complications-Operative and Postpartum: 2 degree perineal laceration Hemoglobin  Date Value Range Status  01/27/2014 10.8* 12.0 - 15.0 g/dL Final     HCT  Date Value Range Status  01/27/2014 32.3* 36.0 - 46.0 % Final    Physical Exam:  General: alert and cooperative Lochia: appropriate Uterine Fundus: firm Incision: perineum intact DVT Evaluation: No evidence of DVT seen on physical exam. Negative Homan's sign. No cords or calf tenderness. No significant calf/ankle edema.  Discharge Diagnoses: Term Pregnancy-delivered  Discharge Information: Date: 01/27/2014 Activity: pelvic rest Diet: routine Medications: PNV and Ibuprofen Condition: stable Instructions: refer to practice specific booklet Discharge to: home   Newborn Data: Live born female  Birth Weight: 7 lb 3.7 oz (3280 g) APGAR: 9, 10  Home with mother.  Keishaun Hazel G 01/27/2014, 8:43 AM

## 2014-01-27 NOTE — Anesthesia Postprocedure Evaluation (Signed)
  Anesthesia Post-op Note  Patient: Samantha Watts  Procedure(s) Performed: * No procedures listed *  Patient Location: PACU and Mother/Baby  Anesthesia Type:Epidural  Level of Consciousness: awake, alert  and oriented  Airway and Oxygen Therapy: Patient Spontanous Breathing  Post-op Pain: none  Post-op Assessment: Post-op Vital signs reviewed, Patient's Cardiovascular Status Stable, No headache, No backache, No residual numbness and No residual motor weakness  Post-op Vital Signs: Reviewed and stable  Complications: No apparent anesthesia complications

## 2014-01-27 NOTE — Progress Notes (Signed)
Post Partum Day 1 Subjective: no complaints, up ad lib, voiding, tolerating PO and desires early discharge  Objective: Blood pressure 111/71, pulse 78, temperature 97.6 F (36.4 C), temperature source Oral, resp. rate 18, height 5' 1.5" (1.562 m), weight 168 lb (76.204 kg), SpO2 97.00%, unknown if currently breastfeeding.  Physical Exam:  General: alert and cooperative Lochia: appropriate Uterine Fundus: firm Incision: perineum intact DVT Evaluation: No evidence of DVT seen on physical exam. Negative Homan's sign. No cords or calf tenderness. No significant calf/ankle edema.   Recent Labs  01/26/14 1155 01/27/14 0620  HGB 12.5 10.8*  HCT 36.2 32.3*    Assessment/Plan: Discharge home   LOS: 1 day   Samantha Watts G 01/27/2014, 8:16 AM

## 2014-02-22 ENCOUNTER — Other Ambulatory Visit: Payer: Self-pay | Admitting: Obstetrics and Gynecology

## 2014-11-01 ENCOUNTER — Encounter (HOSPITAL_COMMUNITY): Payer: Self-pay | Admitting: *Deleted

## 2015-02-28 ENCOUNTER — Other Ambulatory Visit: Payer: Self-pay | Admitting: Obstetrics and Gynecology

## 2015-03-01 LAB — CYTOLOGY - PAP

## 2017-02-05 ENCOUNTER — Encounter: Payer: Self-pay | Admitting: Physician Assistant

## 2017-02-05 ENCOUNTER — Ambulatory Visit (INDEPENDENT_AMBULATORY_CARE_PROVIDER_SITE_OTHER): Payer: 59 | Admitting: Physician Assistant

## 2017-02-05 ENCOUNTER — Ambulatory Visit: Payer: Self-pay | Admitting: Physician Assistant

## 2017-02-05 VITALS — BP 149/95 | HR 81 | Temp 97.9°F | Ht 61.2 in | Wt 132.2 lb

## 2017-02-05 DIAGNOSIS — R030 Elevated blood-pressure reading, without diagnosis of hypertension: Secondary | ICD-10-CM

## 2017-02-05 DIAGNOSIS — J0100 Acute maxillary sinusitis, unspecified: Secondary | ICD-10-CM

## 2017-02-05 MED ORDER — AMOXICILLIN 875 MG PO TABS: 875.0000 mg | ORAL_TABLET | Freq: Two times a day (BID) | ORAL | 0 refills | Status: DC

## 2017-02-05 NOTE — Patient Instructions (Addendum)
Please push fluids.      A humidifier can help especially when the air is dry -if you do not have a humidifier you can boil a pot of water on the stove in your home to help with the dry air.  Nasal saline spray can be helpful to keep the mucus membranes moist and thin the nasal mucus  Mucinex to help with the mucus - the white and blue pill.  IF you received an x-ray today, you will receive an invoice from [facility]. Please contact [facility] at [phone] with questions or concerns regarding your invoice.   IF you received labwork today, you will receive an invoice from [facility]. Please contact [facility] at [phone] with questions or concerns regarding your invoice.   Our billing staff will not be able to assist you with questions regarding bills from these companies.  You will be contacted with the lab results as soon as they are available. The fastest way to get your results is to activate your My Chart account. Instructions are located on the last page of this paperwork. If you have not heard from us regarding the results in 2 weeks, please contact this office.

## 2017-02-05 NOTE — Progress Notes (Signed)
   Samantha SageJulie S Harbour  MRN: [id number] DOB: [birth year]  Subjective:  Pt presents to clinic with sinus pressure for the last week - it is in her maxillary sinus and the pressure is bad - she does not have teeth pain but she is having some dizziness.  She has allergies and she started back on her Flonase last week but she does not feel like it is helping.  She does not feel nasal congestion but she is having thick green mucus.  She does not have a cough or ST or PND.  She has used a few doses of mucinex.  She does not have a PCP - she has a GYN.  She had a sinus infection in December and at that time her BP was elevated - she has never had trouble with her BP in the past - both her parents are on BP medications and her brother who is 2 years older than her is having elevated BP issues.  She exercise, she cooks most of her food, drinks mainly water and does not extra salt her food.  She has not been using sudafed for her sinus symptoms.  Review of Systems  Constitutional: Negative for chills and fever.  HENT: Positive for congestion, rhinorrhea (green) and sinus pressure. Negative for sore throat.   Neurological: Negative for dizziness and headaches.    Patient Active Problem List   Diagnosis Date Noted  . Pruritus ani 07/17/2012    Current Outpatient Prescriptions on File Prior to Visit  Medication Sig Dispense Refill  . Prenat-FeFum-FePo-FA-Omega 3 (CONCEPT DHA) 53.5-38-1 MG CAPS daily.     No current facility-administered medications on file prior to visit.     No Known Allergies  Pt patients past, family and social history were reviewed and updated.   Objective:  BP (!) 149/95   Pulse 81   Temp 97.9 F (36.6 C) (Oral)   Ht 5' 1.2" (1.554 m)   Wt 132 lb 3.2 oz (60 kg)   LMP 01/15/2017 (Approximate)   SpO2 100%   BMI 24.82 kg/m   Physical Exam  Constitutional: She is oriented to person, place, and time and well-developed, well-nourished, and in no distress.  HENT:  Head:  Normocephalic and atraumatic.  Right Ear: Hearing, tympanic membrane, external ear and ear canal normal.  Left Ear: Hearing, tympanic membrane, external ear and ear canal normal.  Nose: Right sinus exhibits maxillary sinus tenderness. Left sinus exhibits maxillary sinus tenderness.  Mouth/Throat: Uvula is midline, oropharynx is clear and moist and mucous membranes are normal.  Eyes: Conjunctivae are normal.  Neck: Normal range of motion.  Cardiovascular: Normal rate, regular rhythm and normal heart sounds.   No murmur heard. Pulmonary/Chest: Effort normal and breath sounds normal.  Neurological: She is alert and oriented to person, place, and time. Gait normal.  Skin: Skin is warm and dry.  Psychiatric: Mood, memory, affect and judgment normal.  Vitals reviewed.   Assessment and Plan :  Acute non-recurrent maxillary sinusitis - Plan: amoxicillin (AMOXIL) 875 MG tablet  Elevated BP without diagnosis of hypertension - pt to monitor and find out more about family history - she will not use any cold preps OTC as most have a decongestant which could potentially increase her BP.  She will recheck with me when she is well.  Benny LennertSarah Mamoudou Mulvehill PA-C  [facility] 02/05/2017 2:58 PM

## 2017-02-22 ENCOUNTER — Ambulatory Visit (INDEPENDENT_AMBULATORY_CARE_PROVIDER_SITE_OTHER): Payer: 59 | Admitting: Physician Assistant

## 2017-02-22 VITALS — BP 131/87 | HR 70 | Temp 98.4°F | Resp 16 | Ht 61.2 in | Wt 131.2 lb

## 2017-02-22 DIAGNOSIS — R030 Elevated blood-pressure reading, without diagnosis of hypertension: Secondary | ICD-10-CM | POA: Diagnosis not present

## 2017-02-22 NOTE — Progress Notes (Signed)
   Samantha Watts  MRN: [id number] DOB: 1976-06-27  Subjective:  Pt presents to clinic for recheck of her BP - she has been checking at home and it has been a range of 120/80s with the max being high 130s/ low 90s.  Pt eats a healthy diet.  She is on OCP but has been on them for a long time.  Pt has a significant family hx of HTN.  129/92 - her cuff 128/86 - manual BP  Review of Systems  Constitutional: Negative for chills and fever.  Respiratory: Negative for shortness of breath.   Cardiovascular: Negative for chest pain, palpitations and leg swelling.  Neurological: Negative for headaches.    Patient Active Problem List   Diagnosis Date Noted  . Pruritus ani 07/17/2012    Current Outpatient Prescriptions on File Prior to Visit  Medication Sig Dispense Refill  . SPRINTEC 28 0.25-35 MG-MCG tablet      No current facility-administered medications on file prior to visit.     No Known Allergies  Pt patients past, family and social history were reviewed and updated.   Objective:  BP 131/87   Pulse 70   Temp 98.4 F (36.9 C) (Oral)   Resp 16   Ht 5' 1.2" (1.554 m)   Wt 131 lb 3.2 oz (59.5 kg)   SpO2 100%   BMI 24.63 kg/m   Physical Exam  Constitutional: She is oriented to person, place, and time and well-developed, well-nourished, and in no distress.  HENT:  Head: Normocephalic and atraumatic.  Right Ear: Hearing and external ear normal.  Left Ear: Hearing and external ear normal.  Eyes: Conjunctivae are normal.  Neck: Normal range of motion.  Cardiovascular: Normal rate, regular rhythm and normal heart sounds.   No murmur heard. Pulmonary/Chest: Effort normal and breath sounds normal. She has no wheezes.  Neurological: She is alert and oriented to person, place, and time. Gait normal.  Skin: Skin is warm and dry.  Psychiatric: Mood, memory, affect and judgment normal.  Vitals reviewed.   Assessment and Plan :  Elevated BP without diagnosis of hypertension -  d/w pt that at this time we should continue to monitor - pt is worried that it might be her OCP and though this is a possibility it is low as she has had normal BP while on OCP but she is not interested in more children so we discussed a LARC method of birth control as this does not include estrogen - she will think about her options - she will monitor her BP for the next 1-3 months and f/u with me at least at the 3 month s mark sooner if she notices her readgins are consistently above 140/90.  Benny LennertSarah Laquilla Dault PA-C  [facility] 02/25/2017 8:44 PM

## 2017-02-22 NOTE — Patient Instructions (Addendum)
We do not want consistently over 140/90  The range of 130/80-140/90 is the monitor range   Intrauterine Device Information Introduction An intrauterine device (IUD) is a medical device that gets inserted into the uterus to prevent pregnancy. It is a small, T-shaped device that has one or two nylon strings hanging down from it. The strings hang out of the lower part of the uterus (cervix) to allow for future IUD removal. There are two types of IUDs available:  Copper IUD. This type of IUD has copper wire wrapped around it. A copper IUD may last up to 10 years.  Hormone IUD. This type of IUD is made of plastic and contains the hormone progestin (synthetic progesterone). A hormone IUD may last 3 to 5 years. IUDs are inserted through the vagina and placed into the uterus with a minor medical procedure. How does the IUD work? Copper in the copper IUD prevents pregnancy by making the uterus and fallopian tubes produce a fluid that kills sperm. Synthetic progesterone in hormonal IUD prevents pregnancy by:  Thickening cervical mucus to prevent sperm from entering the uterus.  Thinning the uterine lining to prevent implantation of a fertilized egg.  Weakening or killing sperm that get into the uterus. What are the advantages of an IUD?  IUDs are highly effective, reversible, long-acting, and low-maintenance.  There are no estrogen-related side effects.  An IUD can be used when breastfeeding.  IUDs are not associated with weight gain.  Advantages of the copper IUD are that:  It works immediately after insertion.  It does not interfere with your body's natural hormones.  It can be used for 10 years.  Advantages of the hormone IUD are that:  If it is inserted within 7 days of your period starting, it works immediately after insertion. If the hormone IUD is inserted at any other time in your cycle, you will need to use a backup method of birth control for 7 days after insertion.  It  can make menstrual periods lighter.  It can decrease menstrual cramping.  It can be used for 3 or 5 years. What are the disadvantages of an IUD?  The hormone IUD may cause irregular menstrual bleeding for a period of time after insertion.  The copper IUD can make your menstrual flow heavier and more painful.  You may experience some pain during insertion, and cramping and vaginal bleeding after insertion. How is the IUD removed? Is the IUD right for me?  Your health care provider will make sure you are a good candidate for an IUD and will discuss side effects with you. This information is not intended to replace advice given to you by your health care provider. Make sure you discuss any questions you have with your health care provider. Document Released: 11/20/2004 Document Revised: 05/24/2016 Document Reviewed: 06/07/2013  2017 Elsevier  Etonogestrel implant What is this medicine? ETONOGESTREL (et oh noe JES trel) is a contraceptive (birth control) device. It is used to prevent pregnancy. It can be used for up to 3 years. COMMON BRAND NAME(S): Implanon, Nexplanon What should I tell my health care provider before I take this medicine? They need to know if you have any of these conditions: -abnormal vaginal bleeding -blood vessel disease or blood clots -cancer of the breast, cervix, or liver -depression -diabetes -gallbladder disease -headaches -heart disease or recent heart attack -high blood pressure -high cholesterol -kidney disease -liver disease -renal disease -seizures -tobacco smoker -an unusual or allergic reaction to etonogestrel, other hormones,  anesthetics or antiseptics, medicines, foods, dyes, or preservatives -pregnant or trying to get pregnant -breast-feeding How should I use this medicine? This device is inserted just under the skin on the inner side of your upper arm by a health care professional. Talk to your pediatrician regarding the use of this  medicine in children. Special care may be needed. What if I miss a dose? This does not apply. What may interact with this medicine? Do not take this medicine with any of the following medications: -amprenavir -bosentan -fosamprenavir This medicine may also interact with the following medications: -barbiturate medicines for inducing sleep or treating seizures -certain medicines for fungal infections like ketoconazole and itraconazole -griseofulvin -medicines to treat seizures like carbamazepine, felbamate, oxcarbazepine, phenytoin, topiramate -modafinil -phenylbutazone -rifampin -some medicines to treat HIV infection like atazanavir, indinavir, lopinavir, nelfinavir, tipranavir, ritonavir -St. John's wort What should I watch for while using this medicine? This product does not protect you against HIV infection (AIDS) or other sexually transmitted diseases. You should be able to feel the implant by pressing your fingertips over the skin where it was inserted. Contact your doctor if you cannot feel the implant, and use a non-hormonal birth control method (such as condoms) until your doctor confirms that the implant is in place. If you feel that the implant may have broken or become bent while in your arm, contact your healthcare provider. What side effects may I notice from receiving this medicine? Side effects that you should report to your doctor or health care professional as soon as possible: -allergic reactions like skin rash, itching or hives, swelling of the face, lips, or tongue -breast lumps -changes in emotions or moods -depressed mood -heavy or prolonged menstrual bleeding -pain, irritation, swelling, or bruising at the insertion site -scar at site of insertion -signs of infection at the insertion site such as fever, and skin redness, pain or discharge -signs of pregnancy -signs and symptoms of a blood clot such as breathing problems; changes in vision; chest pain; severe,  sudden headache; pain, swelling, warmth in the leg; trouble speaking; sudden numbness or weakness of the face, arm or leg -signs and symptoms of liver injury like dark yellow or brown urine; general ill feeling or flu-like symptoms; light-colored stools; loss of appetite; nausea; right upper belly pain; unusually weak or tired; yellowing of the eyes or skin -unusual vaginal bleeding, discharge -signs and symptoms of a stroke like changes in vision; confusion; trouble speaking or understanding; severe headaches; sudden numbness or weakness of the face, arm or leg; trouble walking; dizziness; loss of balance or coordination Side effects that usually do not require medical attention (report to your doctor or health care professional if they continue or are bothersome): -acne -back pain -breast pain -changes in weight -dizziness -general ill feeling or flu-like symptoms -headache -irregular menstrual bleeding -nausea -sore throat -vaginal irritation or inflammation Where should I keep my medicine? This drug is given in a hospital or clinic and will not be stored at home.  2017 Elsevier/Gold Standard (2016-01-19 10:56:20)   IF you received an x-ray today, you will receive an invoice from [facility]. Please contact [facility] at [phone] with questions or concerns regarding your invoice.   IF you received labwork today, you will receive an invoice from [facility]. Please contact [facility] at [phone] with questions or concerns regarding your invoice.   Our billing staff will not be able to assist you with questions regarding bills from these companies.  You will be contacted with the  lab results as soon as they are available. The fastest way to get your results is to activate your My Chart account. Instructions are located on the last page of this paperwork. If you have not heard from us regarding the results in 2 weeks, please contact this office.     

## 2017-02-25 DIAGNOSIS — R030 Elevated blood-pressure reading, without diagnosis of hypertension: Secondary | ICD-10-CM | POA: Insufficient documentation

## 2017-11-26 NOTE — Progress Notes (Signed)
Chief Complaint  Patient presents with  . form for work completed    weber pt    HPI  Pt here for labs for her biometric screening for insurance Her pap smear is up to date She has no concerns today  4 review of systems  Past Medical History:  Diagnosis Date  . Allergy   . Headache(784.0)   . Hemorrhoids     Current Outpatient Medications  Medication Sig Dispense Refill  . Fexofenadine HCl (ALLEGRA PO) Take by mouth.    . SPRINTEC 28 0.25-35 MG-MCG tablet      No current facility-administered medications for this visit.     Allergies: No Known Allergies  Past Surgical History:  Procedure Laterality Date  . DILATION AND EVACUATION  08/28/2011   Procedure: DILATATION AND EVACUATION (D&E);  Surgeon: Jeani HawkingMichelle L Grewal, MD;  Location: [facility];  Service: Gynecology;  Laterality: N/A;  . svd     2012  . WISDOM TOOTH EXTRACTION      Social History   Socioeconomic History  . Marital status: Married    Spouse name: None  . Number of children: None  . Years of education: None  . Highest education level: None  Social Needs  . Financial resource strain: None  . Food insecurity - worry: None  . Food insecurity - inability: None  . Transportation needs - medical: None  . Transportation needs - non-medical: None  Occupational History  . None  Tobacco Use  . Smoking status: Never Smoker  . Smokeless tobacco: Never Used  Substance and Sexual Activity  . Alcohol use: Yes    Comment: socially  . Drug use: No  . Sexual activity: Yes    Birth control/protection: None  Other Topics Concern  . None  Social History Narrative  . None    Family History  Problem Relation Age of Onset  . Hypertension Mother   . Hypertension Father   . Cancer Maternal Grandmother        colon     ROS Review of Systems See HPI Constitution: No fevers or chills No malaise No diaphoresis Skin: No rash or itching Eyes: no blurry vision, no double vision GU: no dysuria or  hematuria Neuro: no dizziness or headaches all others reviewed and negative   Objective: Vitals:   11/27/17 0819  BP: 110/82  Pulse: 81  Resp: 16  Temp: 98 F (36.7 C)  TempSrc: Oral  SpO2: 100%  Weight: 128 lb 12.8 oz (58.4 kg)  Height: 5' 1.2" (1.554 m)    Physical Exam General appearance: alert and appears stated age Eyes: conjunctivae/corneas clear. PERRL, EOM's intact. Fundi benign. Ears: normal TM's and external ear canals both ears  Neck: supple, normal thyroid, no thyromegaly Lungs: clear to auscultation bilaterally Abdomen: soft, non-tender; bowel sounds normal; no masses,  no organomegaly Extremities: extremities normal, atraumatic, no cyanosis or edema Skin: Skin color, texture, turgor normal. No rashes or lesions  Assessment and Plan Samantha FanningJulie was seen today for form for work completed.  Diagnoses and all orders for this visit:  Chronic allergic rhinitis- stable, prn antihistamines  Encounter for employment-related drug testing -     Nicotine/cotinine metabolites  Encounter for physical examination related to employment -     Nicotine/cotinine metabolites -     TSH -     Lipid panel -     Basic metabolic panel  Administrative encounter  Screening, lipid -     Lipid panel  Other orders -  Cancel: Flu Vaccine QUAD 36+ mos IM     Samantha Watts

## 2017-11-27 ENCOUNTER — Other Ambulatory Visit: Payer: Self-pay

## 2017-11-27 ENCOUNTER — Ambulatory Visit: Payer: 59 | Admitting: Family Medicine

## 2017-11-27 ENCOUNTER — Encounter: Payer: Self-pay | Admitting: Family Medicine

## 2017-11-27 VITALS — BP 110/82 | HR 81 | Temp 98.0°F | Resp 16 | Ht 61.2 in | Wt 128.8 lb

## 2017-11-27 DIAGNOSIS — Z13220 Encounter for screening for lipoid disorders: Secondary | ICD-10-CM | POA: Diagnosis not present

## 2017-11-27 DIAGNOSIS — Z029 Encounter for administrative examinations, unspecified: Secondary | ICD-10-CM | POA: Diagnosis not present

## 2017-11-27 DIAGNOSIS — J309 Allergic rhinitis, unspecified: Secondary | ICD-10-CM | POA: Diagnosis not present

## 2017-11-27 DIAGNOSIS — Z0289 Encounter for other administrative examinations: Secondary | ICD-10-CM | POA: Diagnosis not present

## 2017-11-27 DIAGNOSIS — Z0283 Encounter for blood-alcohol and blood-drug test: Secondary | ICD-10-CM

## 2017-11-27 NOTE — Patient Instructions (Addendum)
   IF you received an x-ray today, you will receive an invoice from [facility]. Please contact [facility] at [phone] with questions or concerns regarding your invoice.   IF you received labwork today, you will receive an invoice from [facility]. Please contact [facility] at [phone] with questions or concerns regarding your invoice.   Our billing staff will not be able to assist you with questions regarding bills from these companies.  You will be contacted with the lab results as soon as they are available. The fastest way to get your results is to activate your My Chart account. Instructions are located on the last page of this paperwork. If you have not heard from us regarding the results in 2 weeks, please contact this office.     Allergic Rhinitis Allergic rhinitis is when the mucous membranes in the nose respond to allergens. Allergens are particles in the air that cause your body to have an allergic reaction. This causes you to release allergic antibodies. Through a chain of events, these eventually cause you to release histamine into the blood stream. Although meant to protect the body, it is this release of histamine that causes your discomfort, such as frequent sneezing, congestion, and an itchy, runny nose. What are the causes? Seasonal allergic rhinitis (hay fever) is caused by pollen allergens that may come from grasses, trees, and weeds. Year-round allergic rhinitis (perennial allergic rhinitis) is caused by allergens such as house dust mites, pet dander, and mold spores. What are the signs or symptoms?  Nasal stuffiness (congestion).  Itchy, runny nose with sneezing and tearing of the eyes. How is this diagnosed? Your health care provider can help you determine the allergen or allergens that trigger your symptoms. If you and your health care provider are unable to determine the allergen, skin or blood testing may be used. Your health care provider will  diagnose your condition after taking your health history and performing a physical exam. Your health care provider may assess you for other related conditions, such as asthma, pink eye, or an ear infection. How is this treated? Allergic rhinitis does not have a cure, but it can be controlled by:  Medicines that block allergy symptoms. These may include allergy shots, nasal sprays, and oral antihistamines.  Avoiding the allergen. Hay fever may often be treated with antihistamines in pill or nasal spray forms. Antihistamines block the effects of histamine. There are over-the-counter medicines that may help with nasal congestion and swelling around the eyes. Check with your health care provider before taking or giving this medicine. If avoiding the allergen or the medicine prescribed do not work, there are many new medicines your health care provider can prescribe. Stronger medicine may be used if initial measures are ineffective. Desensitizing injections can be used if medicine and avoidance does not work. Desensitization is when a patient is given ongoing shots until the body becomes less sensitive to the allergen. Make sure you follow up with your health care provider if problems continue. Follow these instructions at home: It is not possible to completely avoid allergens, but you can reduce your symptoms by taking steps to limit your exposure to them. It helps to know exactly what you are allergic to so that you can avoid your specific triggers. Contact a health care provider if:  You have a fever.  You develop a cough that does not stop easily (persistent).  You have shortness of breath.  You start wheezing.  Symptoms interfere with normal daily activities.   This information is not intended to replace advice given to you by your health care provider. Make sure you discuss any questions you have with your health care provider. Document Released: 09/11/2001 Document Revised: 08/17/2016 Document  Reviewed: 08/24/2013 Elsevier Interactive Patient Education  2017 Elsevier Inc.  

## 2017-11-28 ENCOUNTER — Telehealth: Payer: Self-pay | Admitting: Physician Assistant

## 2017-11-28 NOTE — Telephone Encounter (Unsigned)
Copied from CRM [phone]. Topic: Inquiry >> Nov 28, 2017  2:23 PM Raquel SarnaHayes, Teresa G wrote: Appt at 8 am yesterday Pt is needing lab results form fshe gave the office yesterday for her insurance faxed TODAY! It will need to be in by Fri. 11/28/17.  Pt wants a call stating this has been done.

## 2017-11-29 NOTE — Telephone Encounter (Signed)
Patient calling needing these labs faxed to her insurance TODAY! Fax#: [phone]. Please call pt once these have been faxed.

## 2017-11-29 NOTE — Telephone Encounter (Signed)
Faxed labs to number provided [phone] LMOVM for pt to advise fax was completed.

## 2017-11-30 LAB — NICOTINE/COTININE METABOLITES
Cotinine: NOT DETECTED ng/mL
NICOTINE: NOT DETECTED ng/mL

## 2017-11-30 LAB — TSH: TSH: 1.99 u[IU]/mL (ref 0.450–4.500)

## 2017-11-30 LAB — BASIC METABOLIC PANEL
BUN/Creatinine Ratio: 27 — ABNORMAL HIGH (ref 9–23)
BUN: 17 mg/dL (ref 6–20)
CALCIUM: 10.1 mg/dL (ref 8.7–10.2)
CO2: 22 mmol/L (ref 20–29)
Chloride: 104 mmol/L (ref 96–106)
Creatinine, Ser: 0.62 mg/dL (ref 0.57–1.00)
GFR calc Af Amer: 131 mL/min/{1.73_m2} (ref >59)
GFR, EST NON AFRICAN AMERICAN: 114 mL/min/{1.73_m2} (ref >59)
GLUCOSE: 90 mg/dL (ref 65–99)
POTASSIUM: 4.9 mmol/L (ref 3.5–5.2)
Sodium: 142 mmol/L (ref 134–144)

## 2017-11-30 LAB — LIPID PANEL
CHOLESTEROL TOTAL: 191 mg/dL (ref 100–199)
Chol/HDL Ratio: 2.4 ratio (ref 0.0–4.4)
HDL: 79 mg/dL (ref >39)
LDL Calculated: 98 mg/dL (ref 0–99)
Triglycerides: 68 mg/dL (ref 0–149)
VLDL Cholesterol Cal: 14 mg/dL (ref 5–40)

## 2018-08-28 ENCOUNTER — Other Ambulatory Visit: Payer: Self-pay

## 2018-08-28 ENCOUNTER — Encounter: Payer: Self-pay | Admitting: Physician Assistant

## 2018-08-28 ENCOUNTER — Ambulatory Visit: Payer: 59 | Admitting: Physician Assistant

## 2018-08-28 VITALS — BP 146/89 | HR 79 | Temp 97.9°F | Wt 120.0 lb

## 2018-08-28 DIAGNOSIS — S0033XA Contusion of nose, initial encounter: Secondary | ICD-10-CM | POA: Diagnosis not present

## 2018-08-28 DIAGNOSIS — S060X0A Concussion without loss of consciousness, initial encounter: Secondary | ICD-10-CM

## 2018-08-28 NOTE — Patient Instructions (Addendum)
Ice your face:  Put ice in a plastic bag. Place a towel between your skin and the bag or between your plaster splint and the bag. Leave the ice on for 20 minutes, 2-3 times a day. Do this 3-4 times a day until swelling improves.   Pain control: Ibuprofen and/or Tylenol for pain.  Rest your brain for at least 24-48 hours: no screen time, no reading, no driving, no working. You should not resume your sports or activities until you are symptom-free without medicine for at least 24 hours. See below for more details.   Returning to Sports and Activities After a Concussion, Adult A concussion is a brain injury from a direct hit (blow) to the head or body. This blow causes the brain to shake quickly back and forth inside the skull. This can damage brain cells and cause chemical changes in the brain. Anyone can have a concussion. It is common to get a concussion while playing sports or doing athletic activities. Concussions can also happen outside of sports, such as falling and hitting your head. If you have a concussion, you may have temporary problems with brain functions that involve memory, speech, balance, and coordination. You also may also feel dizzy or nauseous. You may have trouble thinking clearly. Symptoms usually go away in a couple of weeks. Sometimes they last longer. It is important to wait to return to activity until your symptoms are completely gone and a health care provider says it is safe to do so. You may also need to take time away from work or other activities that require concentration, depending on how severe your concussion is. Going back too soon increases the risk of another concussion. Concussions can have serious effects on your brain. People who have more than one concussion are at greater risk of having chronic headaches and problems with learning. When can I return to sports or other activities? You should stop participating in an activity immediately after you hit your head or  a concussion is suspected. You need to rest physically and mentally. You should also be monitored carefully by another adult. How quickly you can return to sports and other activities depends on:  Your age.  The severity of your concussion.  Your health before the injury.  Whether you have had a previous concussion.  How should I gradually return to sports or other activities? You should not resume your sports or activities until you are symptom-free without medicine for at least 24 hours. Your health care provider will determine when your symptoms are completely gone and when it is safe for you to practice and play sports again. It is important that you return to sports gradually. Do not try to do too much too soon. Gradually advance through the following activity levels to return to sports: 1. Begin with only light aerobic activity to increase your heart rate. You may bike, walk, or jog for up to 10 minutes. Do not jump, run, or lift weights. 2. Get moderate physical activity with some head and body movements. Running short distances, fast jogging, using a stationary bike, and moderate-intensity weight lifting are okay. 3. Participate in high-intensity exercise without physical contact. 4. Return to your normal practice routine, which may include full contact. 5. Return to play in games, matches, or other competitions.  Some people can progress quickly through these levels. Other people will need several days to go from one level to the next. Do not move on to the next level until you  have been symptom-free for at least 24 hours following the previous level. Symptoms to watch for include:  Fatigue.  Headache.  Problems with balance, coordination, or memory.  If you notice any of these warning signs during exercise or other physical activities, rest for at least 24 hours or until the symptoms go away. You can then resume activity. Start at the activity level that you were on before your  symptoms began. What symptoms are important to report to my health care provider? Concussion symptoms may not appear right away. They could also get worse at any time. It is important to let your health care provider know if you have any new or worsening symptoms, such as:  Drowsiness or fatigue.  Severe or persistent headache.  Memory loss.  Confusion.  Dizziness.  Trouble concentrating.  Nausea and vomiting.  Loss of consciousness.  Weakness or numbness.  Problems with balance and coordination.  Slurred speech.  Seizures.  Trouble recognizing faces or places.  Irritability.  Changes in sleep habits.  Depression.  Personality changes.  Inability to remember events before or after the injury.  Certain health issues may make your recovery from a concussion take longer. Let your health care provider know if you have a history of:  Migraines.  Depression.  Mood disorders.  Anxiety.  A developmental disorder or a brain-related (neurobehavioral) disorder, such as ADHD.  What are some questions to ask my health care provider? When you have a concussion, learning as much as you can about your injury can help you protect your long-term health. Ask your health care provider the following questions:  Is it safe for me to return to sports or other physical activities?  What are the short-term and long-term consequences of my injury?  Should I consider not playing sports anymore?  What happens if I get another concussion?  What are the warning signs of a concussion?  Could I have a concussion without knowing it?  When should I go to the emergency room?  How can I prevent another concussion from happening?  How might the concussion affect my professional life?  Should I limit how much time I watch TV, play video games, or use a computer?  Do I need to take time away from work?  Do I need more sleep than normal?  Do I need medicine for a  concussion?  Could I have problems with memory or learning?  This information is not intended to replace advice given to you by your health care provider. Make sure you discuss any questions you have with your health care provider. Document Released: 04/09/2016 Document Revised: 05/24/2016 Document Reviewed: 01/01/2016 Elsevier Interactive Patient Education  2018 [employer].   IF you received an x-ray today, you will receive an invoice from [facility]. Please contact [facility] at [phone] with questions or concerns regarding your invoice.   IF you received labwork today, you will receive an invoice from [facility]. Please contact [facility] at [phone] with questions or concerns regarding your invoice.   Our billing staff will not be able to assist you with questions regarding bills from these companies.  You will be contacted with the lab results as soon as they are available. The fastest way to get your results is to activate your My Chart account. Instructions are located on the last page of this paperwork. If you have not heard from [place] regarding the results in 2 weeks, please contact this office.

## 2018-08-28 NOTE — Progress Notes (Signed)
   Samantha SageJulie S Watts  MRN: [id number] DOB: 06/25/1976  PCP: Morrell RiddleWeber, Sarah L, PA-C  Subjective:  Pt is a 42 year old female who presents to clinic for dizziness and facial pain. She is here today with her husband.  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  She saw a fight starting on the play equipment and ran quickly up the slide to break it up. She hit the bridge of her nose and then her forehead on a metal support bar. "saw stars". She did not fall off the play equipment. Denies syncope. Endorses pain of her nose and forehead. Tenderness with eye movement all the way to the side or up/down. Endorses some photophobia.  Denies confusion, n/v, ataxia, behavior changes.    Review of Systems  Constitutional: Negative for activity change.  Eyes: Positive for photophobia. Negative for visual disturbance.  Gastrointestinal: Negative for nausea and vomiting.  Skin: Positive for wound.  Neurological: Positive for dizziness and headaches. Negative for syncope and numbness.  Psychiatric/Behavioral: Negative for agitation, behavioral problems, confusion and decreased concentration. The patient is not nervous/anxious.     Patient Active Problem List   Diagnosis Date Noted  . Elevated BP without diagnosis of hypertension 02/25/2017  . Pruritus ani 07/17/2012    Current Outpatient Medications on File Prior to Visit  Medication Sig Dispense Refill  . Fexofenadine HCl (ALLEGRA PO) Take by mouth.    . SPRINTEC 28 0.25-35 MG-MCG tablet      No current facility-administered medications on file prior to visit.     No Known Allergies   Objective:  BP (!) 146/89   Pulse 79   Temp 97.9 F (36.6 C) (Oral)   Wt 120 lb (54.4 kg)   SpO2 100%   BMI 22.53 kg/m   Physical Exam  Constitutional: She is oriented to person, place, and time. No distress.  HENT:  Head: Head is with contusion. Head is without raccoon's eyes and without Battle's sign.    Eyes: Pupils are equal, round, and reactive to  light. EOM are normal. Right eye exhibits no nystagmus. Left eye exhibits no nystagmus.  Pt endorses tenderness with full lateral eye moevements.   Cardiovascular: Normal rate, regular rhythm and normal heart sounds.  Neurological: She is alert and oriented to person, place, and time. She has normal strength. No sensory deficit. She displays a negative Romberg sign.  Normal heel-to-toe walking.  Skin: Skin is warm and dry. Bruising (bridge of nose. ) noted. No abrasion and no lesion noted.     Psychiatric: Judgment normal.  Vitals reviewed.   Assessment and Plan :  1. Concussion without loss of consciousness, initial encounter 2. Contusion of nose, initial encounter - Pt presents with pain of her face s/p blunt trauma on a playground. No red flags, neuro exam is unremarkable. Suspect possible concussion and contusion of nose and forehead. Home care tips discussed - work note written to stay home tomorrow. Advised complete brain rest x 1 week, or until resolution of symptoms x 24 hours. Ice and NSAIDs for contusions. RTC if symptoms fail to improve or worsen. Emergency department guidelines discussed.   Marco CollieWhitney Chaise Passarella, PA-C  [facility] 08/28/2018 2:21 PM  Please note: Portions of this report may have been transcribed using dragon voice recognition software. Every effort was made to ensure accuracy; however, inadvertent computerized transcription errors may be present.

## 2018-09-03 DIAGNOSIS — S060X0A Concussion without loss of consciousness, initial encounter: Secondary | ICD-10-CM | POA: Insufficient documentation

## 2018-09-04 ENCOUNTER — Ambulatory Visit: Payer: 59 | Admitting: Physician Assistant

## 2020-01-19 ENCOUNTER — Other Ambulatory Visit: Payer: Self-pay | Admitting: Radiology

## 2020-01-19 DIAGNOSIS — N6310 Unspecified lump in the right breast, unspecified quadrant: Secondary | ICD-10-CM

## 2020-02-01 ENCOUNTER — Other Ambulatory Visit: Payer: Self-pay

## 2020-02-01 ENCOUNTER — Ambulatory Visit: Payer: Self-pay

## 2020-02-01 ENCOUNTER — Ambulatory Visit
Admission: RE | Admit: 2020-02-01 | Discharge: 2020-02-01 | Disposition: A | Discharge status: Home / Self Care | Payer: Commercial Managed Care - PPO | Source: Ambulatory Visit | Attending: Radiology | Admitting: Radiology

## 2020-02-01 DIAGNOSIS — N6310 Unspecified lump in the right breast, unspecified quadrant: Secondary | ICD-10-CM

## 2021-03-11 IMAGING — MG MM DIGITAL DIAGNOSTIC UNILAT*R* W/ TOMO W/ CAD
4 series · 4 of 12 positions shown · non-contrast
Comparison: Previous exam(s).

CLINICAL DATA: Patient complains of diffuse right breast pain.

EXAM:
DIGITAL DIAGNOSTIC UNILATERAL RIGHT MAMMOGRAM WITH CAD AND TOMO

[R MLO synth-2D]
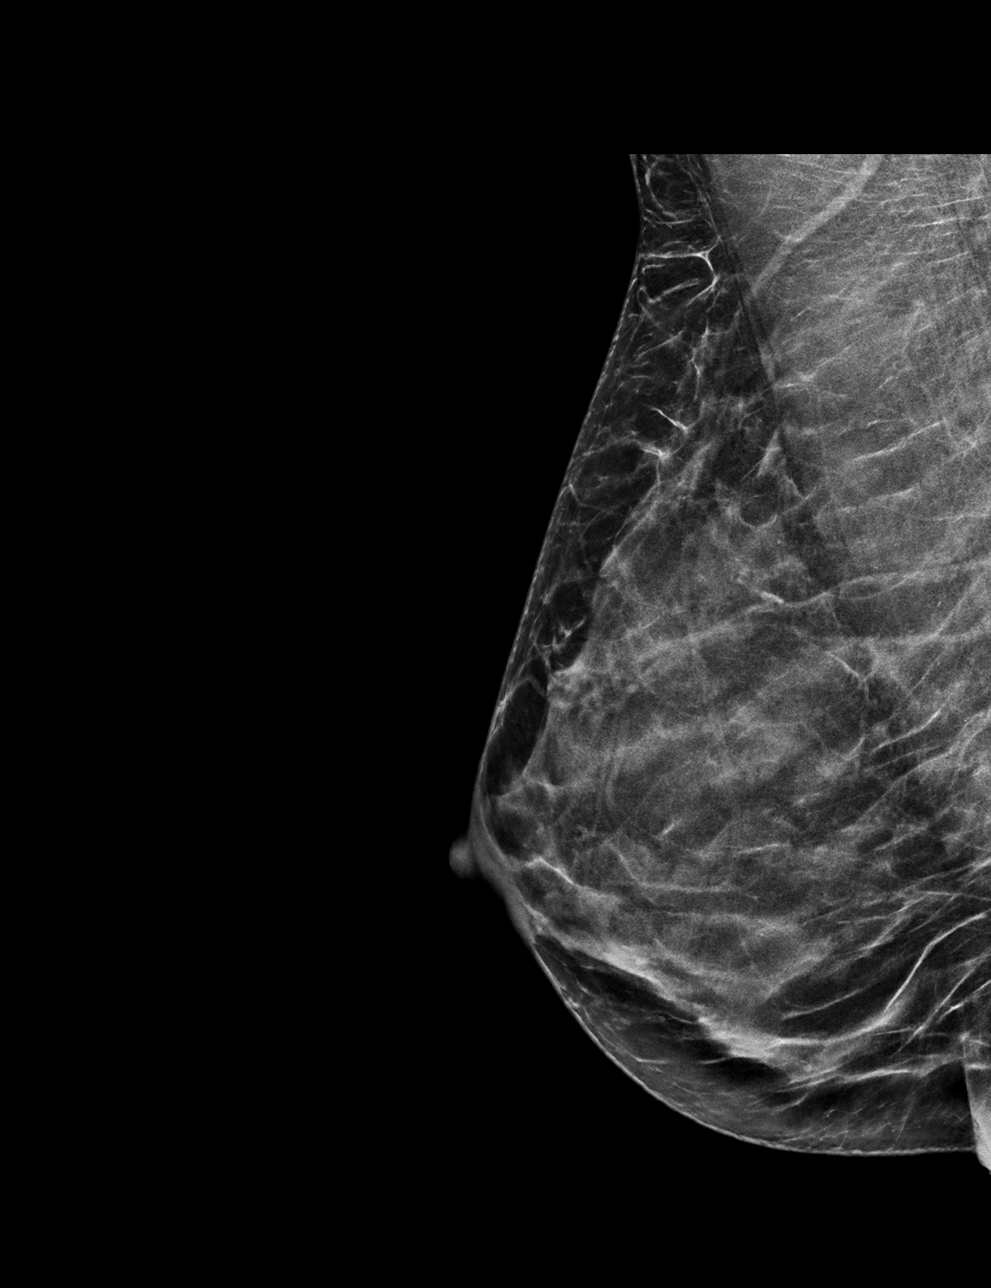

[R CC synth-2D]
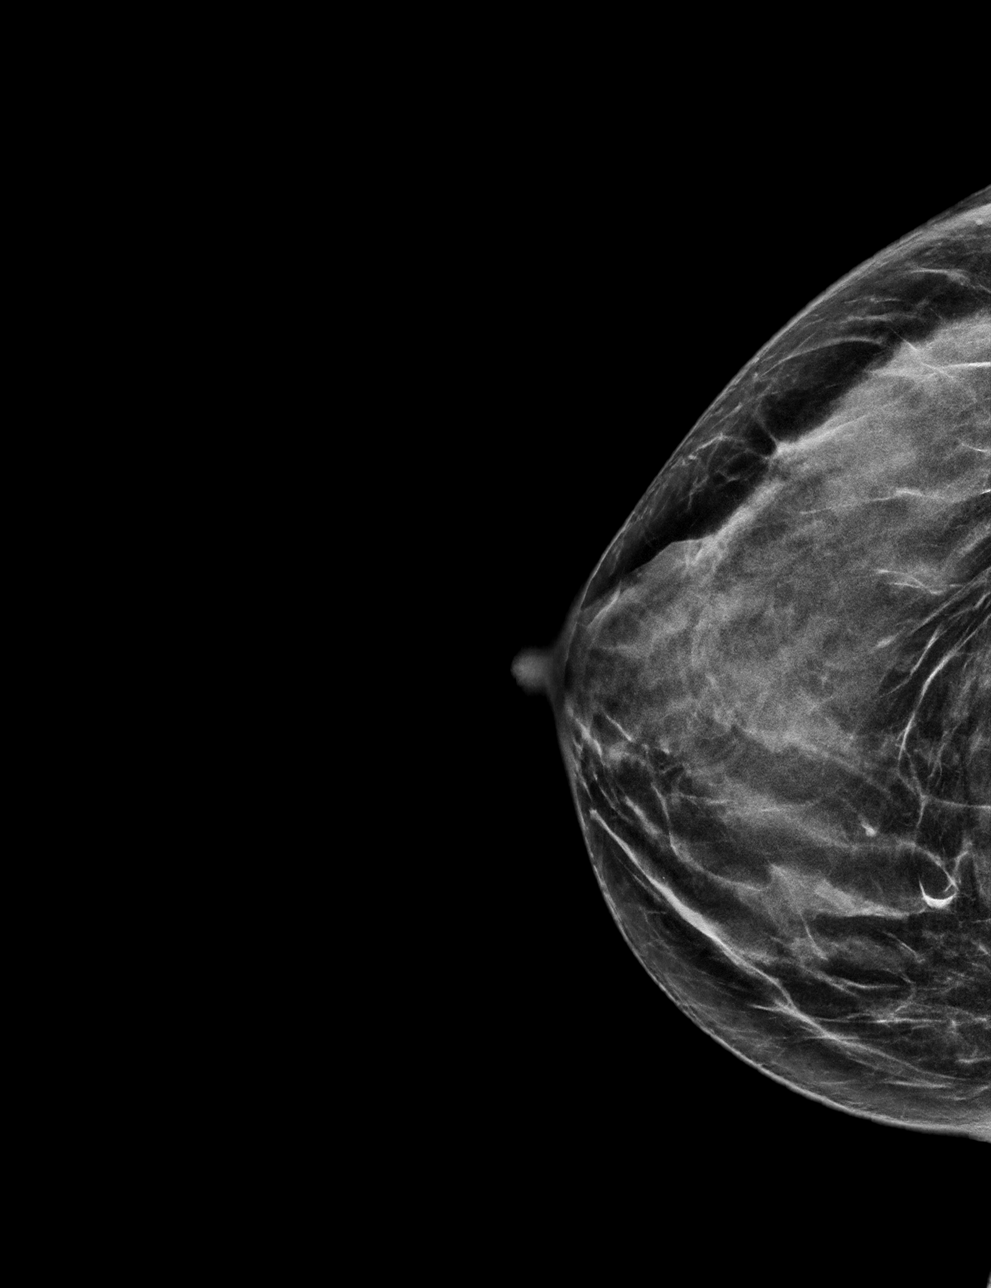

[R CC tomo · tomo slice 27/52.0]
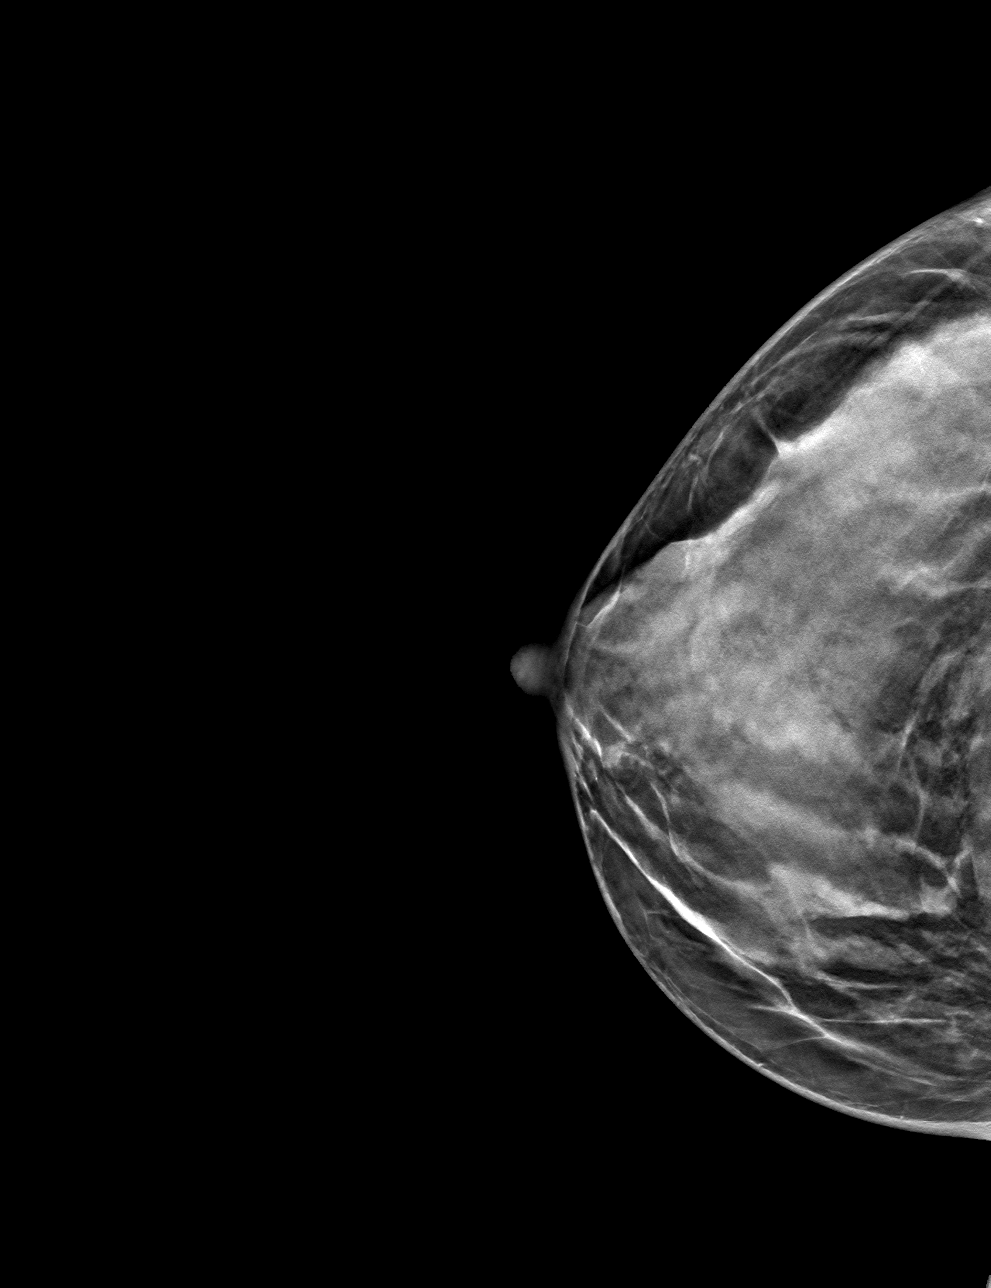

[R MLO tomo · tomo slice 27/53.0]
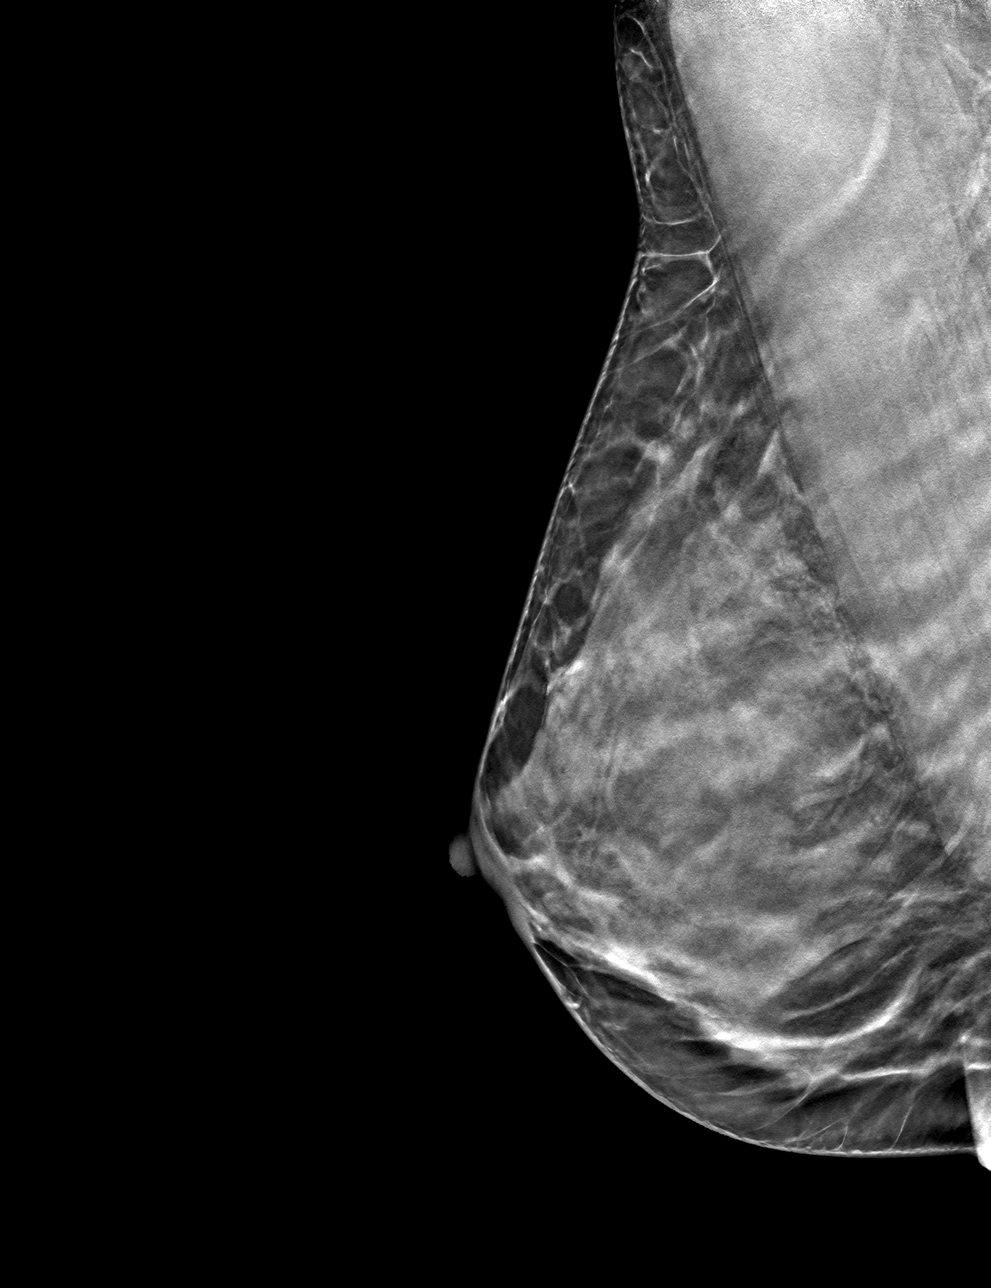

[4 of 12 positions shown; findings below may reference images not displayed]

ACR Breast Density Category d: The breast tissue is extremely dense,
which lowers the sensitivity of mammography.
FINDINGS: No suspicious mass, malignant type microcalcifications or distortion
detected in the right breast.

Mammographic images were processed with CAD.
IMPRESSION: No evidence of malignancy in the right breast.

RECOMMENDATION:
Bilateral screening mammogram in Friday July, 2020 is recommended.

I have discussed the findings and recommendations with the patient.
If applicable, a reminder letter will be sent to the patient
regarding the next appointment.

BI-RADS CATEGORY  1: Negative.
